# Patient Record
Sex: Male | Born: 1950 | Race: White | Hispanic: No | Smoking: Current every day smoker
Health system: Southern US, Community
[De-identification: ages and names within clinical notes are randomized; demographics above are authoritative.]

## PROBLEM LIST (undated history)

## (undated) DIAGNOSIS — C61 Malignant neoplasm of prostate: Secondary | ICD-10-CM

## (undated) DIAGNOSIS — D751 Secondary polycythemia: Secondary | ICD-10-CM

## (undated) DIAGNOSIS — I10 Essential (primary) hypertension: Secondary | ICD-10-CM

## (undated) DIAGNOSIS — Z720 Tobacco use: Secondary | ICD-10-CM

## (undated) HISTORY — DX: Essential (primary) hypertension: I10

## (undated) HISTORY — DX: Tobacco use: Z72.0

## (undated) HISTORY — PX: FOOT NEUROMA SURGERY: SHX646

## (undated) HISTORY — DX: Secondary polycythemia: D75.1

## (undated) HISTORY — DX: Malignant neoplasm of prostate: C61

---

## 2004-05-02 ENCOUNTER — Ambulatory Visit: Payer: Self-pay | Admitting: Internal Medicine

## 2005-06-16 ENCOUNTER — Other Ambulatory Visit: Payer: Self-pay

## 2005-06-16 ENCOUNTER — Emergency Department: Payer: Self-pay | Admitting: Emergency Medicine

## 2012-02-20 DIAGNOSIS — Z8546 Personal history of malignant neoplasm of prostate: Secondary | ICD-10-CM | POA: Insufficient documentation

## 2012-03-25 HISTORY — PX: PROSTATECTOMY: SHX69

## 2013-01-05 LAB — PSA

## 2013-06-10 DIAGNOSIS — N393 Stress incontinence (female) (male): Secondary | ICD-10-CM | POA: Insufficient documentation

## 2013-06-10 DIAGNOSIS — N529 Male erectile dysfunction, unspecified: Secondary | ICD-10-CM | POA: Insufficient documentation

## 2013-07-24 ENCOUNTER — Ambulatory Visit: Payer: Self-pay

## 2014-09-13 ENCOUNTER — Ambulatory Visit: Payer: Self-pay | Admitting: Gastroenterology

## 2014-09-13 LAB — HM COLONOSCOPY

## 2015-01-31 ENCOUNTER — Other Ambulatory Visit: Payer: Self-pay | Admitting: Unknown Physician Specialty

## 2015-01-31 MED ORDER — AMLODIPINE BESYLATE 5 MG PO TABS: 5.0000 mg | ORAL_TABLET | Freq: Every day | ORAL | Status: DC

## 2015-01-31 NOTE — Telephone Encounter (Signed)
E-fax came through for refill on: Rx: Amlodipine Copy in basket.

## 2015-01-31 NOTE — Telephone Encounter (Signed)
Routing to provider. Patient was last seen on 10/25/14, practice partner number is [phone], and [facility] [place].

## 2015-02-04 ENCOUNTER — Other Ambulatory Visit: Payer: Self-pay

## 2015-02-04 MED ORDER — OMEPRAZOLE 20 MG PO CPDR: 20.0000 mg | DELAYED_RELEASE_CAPSULE | Freq: Every day | ORAL | Status: DC

## 2015-02-04 NOTE — Telephone Encounter (Signed)
Patient was last seen on 10/25/14, practice partner number is [phone], and pharmacy [facility] [place].

## 2015-03-01 ENCOUNTER — Other Ambulatory Visit: Payer: Self-pay | Admitting: Unknown Physician Specialty

## 2015-03-01 MED ORDER — LISINOPRIL-HYDROCHLOROTHIAZIDE 20-12.5 MG PO TABS: 1.0000 | ORAL_TABLET | Freq: Every day | ORAL | Status: DC

## 2015-03-01 NOTE — Telephone Encounter (Signed)
Pt would like refill on lisinopril sent to [facility]

## 2015-03-01 NOTE — Telephone Encounter (Signed)
Routing to provider. Patient was last seen 10/25/14, practice partner number is [phone], and pharmacy [facility] [place].

## 2015-03-30 ENCOUNTER — Other Ambulatory Visit: Payer: Self-pay | Admitting: Unknown Physician Specialty

## 2015-04-28 ENCOUNTER — Other Ambulatory Visit: Payer: Self-pay | Admitting: Unknown Physician Specialty

## 2015-05-02 ENCOUNTER — Other Ambulatory Visit: Payer: Self-pay

## 2015-05-02 DIAGNOSIS — C61 Malignant neoplasm of prostate: Secondary | ICD-10-CM

## 2015-05-02 DIAGNOSIS — Z720 Tobacco use: Secondary | ICD-10-CM | POA: Insufficient documentation

## 2015-05-02 DIAGNOSIS — I10 Essential (primary) hypertension: Secondary | ICD-10-CM | POA: Insufficient documentation

## 2015-05-02 DIAGNOSIS — D751 Secondary polycythemia: Secondary | ICD-10-CM | POA: Insufficient documentation

## 2015-05-02 MED ORDER — LISINOPRIL-HYDROCHLOROTHIAZIDE 20-12.5 MG PO TABS: 1.0000 | ORAL_TABLET | Freq: Every day | ORAL | Status: DC

## 2015-05-02 NOTE — Telephone Encounter (Signed)
Called and scheduled patient an appointment for 05/04/15.

## 2015-05-02 NOTE — Telephone Encounter (Signed)
Pharmacy sent a fax stating that patient must have a 90 day supply of medication (lisinopril-HCTZ) per insurance. Patient was last seen 10/25/14, practice partner number is [phone], and pharmacy [facility] [place].

## 2015-05-02 NOTE — Telephone Encounter (Signed)
Due to be seen

## 2015-05-04 ENCOUNTER — Encounter: Payer: Self-pay | Admitting: Unknown Physician Specialty

## 2015-05-04 ENCOUNTER — Ambulatory Visit (INDEPENDENT_AMBULATORY_CARE_PROVIDER_SITE_OTHER): Payer: 59 | Admitting: Unknown Physician Specialty

## 2015-05-04 VITALS — BP 129/80 | HR 82 | Temp 97.9°F | Ht 71.3 in | Wt 189.4 lb

## 2015-05-04 DIAGNOSIS — J069 Acute upper respiratory infection, unspecified: Secondary | ICD-10-CM

## 2015-05-04 DIAGNOSIS — I10 Essential (primary) hypertension: Secondary | ICD-10-CM | POA: Diagnosis not present

## 2015-05-04 LAB — LIPID PANEL PICCOLO, WAIVED
CHOL/HDL RATIO PICCOLO,WAIVE: 2.5 mg/dL
CHOLESTEROL PICCOLO, WAIVED: 170 mg/dL (ref <200)
HDL Chol Piccolo, Waived: 67 mg/dL (ref >59)
LDL Chol Calc Piccolo Waived: 72 mg/dL (ref <100)
TRIGLYCERIDES PICCOLO,WAIVED: 156 mg/dL — AB (ref <150)
VLDL Chol Calc Piccolo,Waive: 31 mg/dL — ABNORMAL HIGH (ref <30)

## 2015-05-04 LAB — MICROALBUMIN, URINE WAIVED
Creatinine, Urine Waived: 200 mg/dL (ref 10–300)
Microalb, Ur Waived: 30 mg/L — ABNORMAL HIGH (ref 0–19)
Microalb/Creat Ratio: <30 mg/g (ref <30)

## 2015-05-04 NOTE — Progress Notes (Signed)
   BP 129/80 mmHg  Pulse 82  Temp(Src) 97.9 F (36.6 C)  Ht 5' 11.3" (1.811 m)  Wt 189 lb 6.4 oz (85.911 kg)  BMI 26.19 kg/m2  SpO2 96%   Subjective:    Patient ID: Billy Duncan, male    DOB: 10/01/1950, 64 y.o.   MRN: [id number]  HPI: Billy Duncan is a 64 y.o. male  Chief Complaint  Patient presents with  . Hypertension  . Cough    pt states he has had a cough, nasal congestion, and some drainage since Saturday 04/23/15.   Hypertension Using medications without difficulty Average home BPs: not checking  No problems or lightheadedness No chest pain with exertion or shortness of breath No Edema  Cough This is a new problem. The current episode started 1 to 4 weeks ago. The problem has been gradually improving. The cough is non-productive. Associated symptoms include rhinorrhea. Pertinent negatives include no chest pain, ear pain, weight loss or wheezing. Nothing aggravates the symptoms. He has tried nothing for the symptoms.      Relevant past medical, surgical, family and social history reviewed and updated as indicated. Interim medical history since our last visit reviewed. Allergies and medications reviewed and updated.  Review of Systems  Constitutional: Negative for weight loss.  HENT: Positive for rhinorrhea. Negative for ear pain.   Respiratory: Positive for cough. Negative for wheezing.   Cardiovascular: Negative for chest pain.    Per HPI unless specifically indicated above     Objective:    BP 129/80 mmHg  Pulse 82  Temp(Src) 97.9 F (36.6 C)  Ht 5' 11.3" (1.811 m)  Wt 189 lb 6.4 oz (85.911 kg)  BMI 26.19 kg/m2  SpO2 96%  Wt Readings from Last 3 Encounters:  05/04/15 189 lb 6.4 oz (85.911 kg)  10/25/14 191 lb (86.637 kg)    Physical Exam  Constitutional: He is oriented to person, place, and time. He appears well-developed and well-nourished. No distress.  HENT:  Head: Normocephalic and atraumatic.  Eyes: Conjunctivae and lids are  normal. Right eye exhibits no discharge. Left eye exhibits no discharge. No scleral icterus.  Cardiovascular: Normal rate, regular rhythm and normal heart sounds.   Pulmonary/Chest: Effort normal and breath sounds normal. No respiratory distress.  Abdominal: Normal appearance. There is no splenomegaly or hepatomegaly.  Musculoskeletal: Normal range of motion.  Neurological: He is alert and oriented to person, place, and time.  Skin: Skin is warm, dry and intact. No rash noted. No pallor.  Psychiatric: He has a normal mood and affect. His behavior is normal. Judgment and thought content normal.       Assessment & Plan:   Problem List Items Addressed This Visit      Unprioritized   Hypertension - Primary    Stable, continue present medications.       Relevant Medications   sildenafil (REVATIO) 20 MG tablet   Other Relevant Orders   Lipid Panel Piccolo, Waived   Microalbumin, Urine Waived   Uric acid   Comprehensive metabolic panel    Other Visit Diagnoses    URI (upper respiratory infection)        Supportive care        Follow up plan: Return in about 6 months (around 11/01/2015) for Physical.

## 2015-05-04 NOTE — Assessment & Plan Note (Signed)
Stable, continue present medications.   

## 2015-05-05 LAB — COMPREHENSIVE METABOLIC PANEL
ALT: 28 IU/L (ref 0–44)
AST: 27 IU/L (ref 0–40)
Albumin/Globulin Ratio: 1.9 (ref 1.1–2.5)
Albumin: 4.5 g/dL (ref 3.6–4.8)
Alkaline Phosphatase: 97 IU/L (ref 39–117)
BUN/Creatinine Ratio: 9 — ABNORMAL LOW (ref 10–22)
BUN: 12 mg/dL (ref 8–27)
Bilirubin Total: 1 mg/dL (ref 0.0–1.2)
CALCIUM: 10 mg/dL (ref 8.6–10.2)
CHLORIDE: 96 mmol/L — AB (ref 97–106)
CO2: 22 mmol/L (ref 18–29)
Creatinine, Ser: 1.34 mg/dL — ABNORMAL HIGH (ref 0.76–1.27)
GFR, EST AFRICAN AMERICAN: 65 mL/min/{1.73_m2} (ref >59)
GFR, EST NON AFRICAN AMERICAN: 56 mL/min/{1.73_m2} — AB (ref >59)
GLUCOSE: 91 mg/dL (ref 65–99)
Globulin, Total: 2.4 g/dL (ref 1.5–4.5)
Potassium: 4.4 mmol/L (ref 3.5–5.2)
Sodium: 134 mmol/L — ABNORMAL LOW (ref 136–144)
TOTAL PROTEIN: 6.9 g/dL (ref 6.0–8.5)

## 2015-05-05 LAB — URIC ACID: URIC ACID: 7.9 mg/dL (ref 3.7–8.6)

## 2015-05-06 ENCOUNTER — Encounter: Payer: Self-pay | Admitting: Unknown Physician Specialty

## 2015-07-25 ENCOUNTER — Other Ambulatory Visit: Payer: Self-pay | Admitting: Unknown Physician Specialty

## 2015-07-28 ENCOUNTER — Other Ambulatory Visit: Payer: Self-pay | Admitting: Unknown Physician Specialty

## 2015-07-28 NOTE — Telephone Encounter (Signed)
Pt came by needs a refill on Lisinopril. [facility] [place]. Thanks.

## 2015-07-29 ENCOUNTER — Other Ambulatory Visit: Payer: Self-pay | Admitting: Unknown Physician Specialty

## 2015-07-29 MED ORDER — LISINOPRIL-HYDROCHLOROTHIAZIDE 20-12.5 MG PO TABS: 1.0000 | ORAL_TABLET | Freq: Every day | ORAL | Status: DC

## 2015-07-29 NOTE — Telephone Encounter (Signed)
Patient was last seen 05/04/15 and has appointment 10/26/15. Pharmacy [facility].

## 2015-08-17 DIAGNOSIS — R7989 Other specified abnormal findings of blood chemistry: Secondary | ICD-10-CM | POA: Insufficient documentation

## 2015-08-17 DIAGNOSIS — R339 Retention of urine, unspecified: Secondary | ICD-10-CM | POA: Insufficient documentation

## 2015-10-26 ENCOUNTER — Ambulatory Visit (INDEPENDENT_AMBULATORY_CARE_PROVIDER_SITE_OTHER): Payer: 59 | Admitting: Unknown Physician Specialty

## 2015-10-26 ENCOUNTER — Encounter: Payer: Self-pay | Admitting: Unknown Physician Specialty

## 2015-10-26 VITALS — BP 130/65 | HR 88 | Temp 98.1°F | Ht 71.3 in | Wt 190.2 lb

## 2015-10-26 DIAGNOSIS — Z0000 Encounter for general adult medical examination without abnormal findings: Secondary | ICD-10-CM | POA: Diagnosis not present

## 2015-10-26 DIAGNOSIS — I10 Essential (primary) hypertension: Secondary | ICD-10-CM

## 2015-10-26 MED ORDER — AMLODIPINE BESYLATE 5 MG PO TABS: 5.0000 mg | ORAL_TABLET | Freq: Every day | ORAL | Status: DC

## 2015-10-26 MED ORDER — LISINOPRIL-HYDROCHLOROTHIAZIDE 20-12.5 MG PO TABS: 1.0000 | ORAL_TABLET | Freq: Every day | ORAL | Status: DC

## 2015-10-26 MED ORDER — OMEPRAZOLE 20 MG PO CPDR: 20.0000 mg | DELAYED_RELEASE_CAPSULE | Freq: Every day | ORAL | Status: DC

## 2015-10-26 NOTE — Assessment & Plan Note (Signed)
Stable, continue present medications.   

## 2015-10-26 NOTE — Progress Notes (Signed)
BP 130/65 mmHg  Pulse 88  Temp(Src) 98.1 F (36.7 C)  Ht 5' 11.3" (1.811 m)  Wt 190 lb 3.2 oz (86.274 kg)  BMI 26.31 kg/m2  SpO2 95%   Subjective:    Patient ID: Billy Duncan, male    DOB: 1950/08/23, 66 y.o.   MRN: [id number]  HPI: Billy Duncan is a 65 y.o. male  Chief Complaint  Patient presents with  . Annual Exam    HIV and Hep C orders entered   Social History   Social History  . Marital Status: Married    Spouse Name: N/A  . Number of Children: N/A  . Years of Education: N/A   Occupational History  . Not on file.   Social History Main Topics  . Smoking status: Current Every Day Smoker -- 0.50 packs/day    Types: Cigarettes  . Smokeless tobacco: Never Used  . Alcohol Use: 8.4 oz/week    0 Standard drinks or equivalent, 14 Glasses of wine per week  . Drug Use: No  . Sexual Activity: Yes     Comment: pt states very seldem   Other Topics Concern  . Not on file   Social History Narrative   Family History  Problem Relation Age of Onset  . Heart disease Mother     a-fib  . Dementia Mother   . Cancer Father     stomach  . Heart disease Brother   . Heart disease Brother    Past Surgical History  Procedure Laterality Date  . Foot neuroma surgery Left   . Prostatectomy  03/2012   Past Medical History  Diagnosis Date  . 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 Hypertension   . Tobacco use   . Polycythemia    Hypertension Using medications without difficulty Average home BPs 125/75  No problems or lightheadedness No chest pain with exertion or shortness of breath No Edema   Relevant past medical, surgical, family and social history reviewed and updated as indicated. Interim medical history since our last visit reviewed. Allergies and medications reviewed and updated.  Review of Systems  Constitutional: Negative.   HENT: Negative.   Eyes: Negative.   Respiratory: Negative.   Cardiovascular: Negative.   Gastrointestinal: Negative.    Endocrine: Negative.   Genitourinary: Negative.   Skin: Negative.   Allergic/Immunologic: Negative.   Neurological: Negative.   Hematological: Negative.   Psychiatric/Behavioral: Negative.     Per HPI unless specifically indicated above     Objective:    BP 130/65 mmHg  Pulse 88  Temp(Src) 98.1 F (36.7 C)  Ht 5' 11.3" (1.811 m)  Wt 190 lb 3.2 oz (86.274 kg)  BMI 26.31 kg/m2  SpO2 95%  Wt Readings from Last 3 Encounters:  10/26/15 190 lb 3.2 oz (86.274 kg)  05/04/15 189 lb 6.4 oz (85.911 kg)  10/25/14 191 lb (86.637 kg)    Physical Exam  Constitutional: He is oriented to person, place, and time. He appears well-developed and well-nourished.  HENT:  Head: Normocephalic.  Right Ear: Tympanic membrane, external ear and ear canal normal.  Left Ear: Tympanic membrane, external ear and ear canal normal.  Mouth/Throat: Uvula is midline, oropharynx is clear and moist and mucous membranes are normal.  Eyes: Pupils are equal, round, and reactive to light.  Cardiovascular: Normal rate, regular rhythm and normal heart sounds.  Exam reveals no gallop and no friction rub.   No murmur heard. Pulmonary/Chest: Effort normal and breath sounds normal. No  respiratory distress.  Abdominal: Soft. Bowel sounds are normal. He exhibits no distension. There is no tenderness.  Musculoskeletal: Normal range of motion.  Neurological: He is alert and oriented to person, place, and time. He has normal reflexes.  Skin: Skin is warm and dry.  Psychiatric: He has a normal mood and affect. His behavior is normal. Judgment and thought content normal.    Results for orders placed or performed in visit on 05/04/15  Lipid Panel Piccolo, [employer]  Result Value Ref Range   Cholesterol Piccolo, Waived 170 <200 mg/dL   HDL Chol Piccolo, Waived 67 >59 mg/dL   Triglycerides Piccolo,Waived 156 (H) <150 mg/dL   Chol/HDL Ratio Piccolo,Waive 2.5 mg/dL   LDL Chol Calc Piccolo Waived 72 <100 mg/dL   VLDL Chol Calc  Piccolo,Waive 31 (H) <30 mg/dL  Microalbumin, Urine Waived  Result Value Ref Range   Microalb, Ur Waived 30 (H) 0 - 19 mg/L   Creatinine, Urine Waived 200 10 - 300 mg/dL   Microalb/Creat Ratio <30 <30 mg/g  Uric acid  Result Value Ref Range   Uric Acid 7.9 3.7 - 8.6 mg/dL  Comprehensive metabolic panel  Result Value Ref Range   Glucose 91 65 - 99 mg/dL   BUN 12 8 - 27 mg/dL   Creatinine, Ser 1.34 (H) 0.76 - 1.27 mg/dL   GFR calc non Af Amer 56 (L) >59 mL/min/1.73   GFR calc Af Amer 65 >59 mL/min/1.73   BUN/Creatinine Ratio 9 (L) 10 - 22   Sodium 134 (L) 136 - 144 mmol/L   Potassium 4.4 3.5 - 5.2 mmol/L   Chloride 96 (L) 97 - 106 mmol/L   CO2 22 18 - 29 mmol/L   Calcium 10.0 8.6 - 10.2 mg/dL   Total Protein 6.9 6.0 - 8.5 g/dL   Albumin 4.5 3.6 - 4.8 g/dL   Globulin, Total 2.4 1.5 - 4.5 g/dL   Albumin/Globulin Ratio 1.9 1.1 - 2.5   Bilirubin Total 1.0 0.0 - 1.2 mg/dL   Alkaline Phosphatase 97 39 - 117 IU/L   AST 27 0 - 40 IU/L   ALT 28 0 - 44 IU/L      Assessment & Plan:   Problem List Items Addressed This Visit      Unprioritized   Hypertension    Stable, continue present medications.        Relevant Medications   amLODipine (NORVASC) 5 MG tablet   lisinopril-hydrochlorothiazide (PRINZIDE,ZESTORETIC) 20-12.5 MG tablet   Other Relevant Orders   Comprehensive metabolic panel    Other Visit Diagnoses    Health care maintenance    -  Primary    Relevant Orders    Hepatitis C antibody    HIV antibody    TSH    CBC with Differential/Platelet    Cologuard    Lipid Panel w/o Chol/HDL Ratio        Follow up plan: Return in about 6 months (around 04/27/2016).

## 2015-10-27 LAB — COMPREHENSIVE METABOLIC PANEL
ALT: 37 IU/L (ref 0–44)
AST: 35 IU/L (ref 0–40)
Albumin/Globulin Ratio: 2.1 (ref 1.2–2.2)
Albumin: 4.7 g/dL (ref 3.6–4.8)
Alkaline Phosphatase: 75 IU/L (ref 39–117)
BILIRUBIN TOTAL: 1.1 mg/dL (ref 0.0–1.2)
BUN/Creatinine Ratio: 9 — ABNORMAL LOW (ref 10–24)
BUN: 13 mg/dL (ref 8–27)
CALCIUM: 10.5 mg/dL — AB (ref 8.6–10.2)
CO2: 24 mmol/L (ref 18–29)
Chloride: 93 mmol/L — ABNORMAL LOW (ref 96–106)
Creatinine, Ser: 1.41 mg/dL — ABNORMAL HIGH (ref 0.76–1.27)
GFR, EST AFRICAN AMERICAN: 61 mL/min/{1.73_m2} (ref >59)
GFR, EST NON AFRICAN AMERICAN: 53 mL/min/{1.73_m2} — AB (ref >59)
Globulin, Total: 2.2 g/dL (ref 1.5–4.5)
Glucose: 96 mg/dL (ref 65–99)
Potassium: 5 mmol/L (ref 3.5–5.2)
SODIUM: 136 mmol/L (ref 134–144)
TOTAL PROTEIN: 6.9 g/dL (ref 6.0–8.5)

## 2015-10-27 LAB — LIPID PANEL W/O CHOL/HDL RATIO
CHOLESTEROL TOTAL: 205 mg/dL — AB (ref 100–199)
HDL: 77 mg/dL (ref >39)
LDL Calculated: 89 mg/dL (ref 0–99)
TRIGLYCERIDES: 195 mg/dL — AB (ref 0–149)
VLDL CHOLESTEROL CAL: 39 mg/dL (ref 5–40)

## 2015-10-27 LAB — CBC WITH DIFFERENTIAL/PLATELET
BASOS ABS: 0.1 10*3/uL (ref 0.0–0.2)
Basos: 2 %
EOS (ABSOLUTE): 0.2 10*3/uL (ref 0.0–0.4)
Eos: 6 %
Hematocrit: 50 % (ref 37.5–51.0)
Hemoglobin: 17.7 g/dL (ref 12.6–17.7)
IMMATURE GRANS (ABS): 0 10*3/uL (ref 0.0–0.1)
Immature Granulocytes: 0 %
LYMPHS: 33 %
Lymphocytes Absolute: 1.3 10*3/uL (ref 0.7–3.1)
MCH: 36.7 pg — AB (ref 26.6–33.0)
MCHC: 35.4 g/dL (ref 31.5–35.7)
MCV: 104 fL — ABNORMAL HIGH (ref 79–97)
Monocytes Absolute: 0.5 10*3/uL (ref 0.1–0.9)
Monocytes: 13 %
NEUTROS PCT: 46 %
Neutrophils Absolute: 1.8 10*3/uL (ref 1.4–7.0)
PLATELETS: 198 10*3/uL (ref 150–379)
RBC: 4.82 x10E6/uL (ref 4.14–5.80)
RDW: 13.3 % (ref 12.3–15.4)
WBC: 3.9 10*3/uL (ref 3.4–10.8)

## 2015-10-27 LAB — HIV ANTIBODY (ROUTINE TESTING W REFLEX): HIV SCREEN 4TH GENERATION: NONREACTIVE

## 2015-10-27 LAB — TSH: TSH: 2.82 u[IU]/mL (ref 0.450–4.500)

## 2015-10-27 LAB — HEPATITIS C ANTIBODY: Hep C Virus Ab: <0.1 s/co ratio (ref 0.0–0.9)

## 2015-10-28 ENCOUNTER — Telehealth: Payer: Self-pay | Admitting: Unknown Physician Specialty

## 2015-10-28 ENCOUNTER — Encounter: Payer: Self-pay | Admitting: Unknown Physician Specialty

## 2015-10-28 ENCOUNTER — Other Ambulatory Visit: Payer: Self-pay | Admitting: Unknown Physician Specialty

## 2015-10-28 DIAGNOSIS — D7589 Other specified diseases of blood and blood-forming organs: Secondary | ICD-10-CM

## 2015-10-28 NOTE — Telephone Encounter (Signed)
Discussed labs with pt.  Kidney function are low.  Noted macrocytosis.  Stop Meloxicam.  Discussed cutting back ETOH.  Anemia panel next visit.

## 2016-04-30 ENCOUNTER — Encounter: Payer: Self-pay | Admitting: Unknown Physician Specialty

## 2016-04-30 ENCOUNTER — Ambulatory Visit (INDEPENDENT_AMBULATORY_CARE_PROVIDER_SITE_OTHER): Payer: 59 | Admitting: Unknown Physician Specialty

## 2016-04-30 VITALS — BP 137/74 | HR 103 | Temp 97.9°F | Ht 72.5 in | Wt 178.8 lb

## 2016-04-30 DIAGNOSIS — D7589 Other specified diseases of blood and blood-forming organs: Secondary | ICD-10-CM | POA: Diagnosis not present

## 2016-04-30 DIAGNOSIS — N183 Chronic kidney disease, stage 3 unspecified: Secondary | ICD-10-CM | POA: Insufficient documentation

## 2016-04-30 DIAGNOSIS — I10 Essential (primary) hypertension: Secondary | ICD-10-CM | POA: Diagnosis not present

## 2016-04-30 DIAGNOSIS — Z720 Tobacco use: Secondary | ICD-10-CM

## 2016-04-30 DIAGNOSIS — Z23 Encounter for immunization: Secondary | ICD-10-CM

## 2016-04-30 NOTE — Assessment & Plan Note (Signed)
Encouraged to quit. 

## 2016-04-30 NOTE — Assessment & Plan Note (Signed)
Check labs 

## 2016-04-30 NOTE — Assessment & Plan Note (Signed)
Stable, continue present medications.   

## 2016-04-30 NOTE — Patient Instructions (Addendum)

## 2016-04-30 NOTE — Progress Notes (Signed)
BP 137/74 (BP Location: Left Arm, Patient Position: Sitting, Cuff Size: Normal)   Pulse (!) 103   Temp 97.9 F (36.6 C)   Ht 6' 0.5" (1.842 m)   Wt 178 lb 12.8 oz (81.1 kg)   SpO2 95%   BMI 23.92 kg/m    Subjective:    Patient ID: Billy Duncan, male    DOB: 01/15/1951, 65 y.o.   MRN: [id number]  HPI: Billy Duncan is a 65 y.o. male  Chief Complaint  Patient presents with  . Hypertension   Hypertension Using medications without difficulty Average home BPs Not checking   No problems or lightheadedness No chest pain with exertion or shortness of breath No Edema  GERD Takes Omeprazole daily.  Symptoms are controlled  Macrocytosis Needs labs for f/u  Not currently using Sildenafil   Relevant past medical, surgical, family and social history reviewed and updated as indicated. Interim medical history since our last visit reviewed. Allergies and medications reviewed and updated.  Review of Systems  Constitutional: Negative.   HENT: Negative.   Eyes: Negative.   Respiratory: Negative.   Cardiovascular: Negative.   Gastrointestinal: Negative.   Endocrine: Negative.   Genitourinary: Negative.   Skin: Negative.   Allergic/Immunologic: Negative.   Neurological: Negative.   Hematological: Negative.   Psychiatric/Behavioral: Negative.     Per HPI unless specifically indicated above     Objective:    BP 137/74 (BP Location: Left Arm, Patient Position: Sitting, Cuff Size: Normal)   Pulse (!) 103   Temp 97.9 F (36.6 C)   Ht 6' 0.5" (1.842 m)   Wt 178 lb 12.8 oz (81.1 kg)   SpO2 95%   BMI 23.92 kg/m   Wt Readings from Last 3 Encounters:  04/30/16 178 lb 12.8 oz (81.1 kg)  10/26/15 190 lb 3.2 oz (86.3 kg)  05/04/15 189 lb 6.4 oz (85.9 kg)    Physical Exam  Constitutional: He is oriented to person, place, and time. He appears well-developed and well-nourished. No distress.  HENT:  Head: Normocephalic and atraumatic.  Eyes: Conjunctivae and lids are  normal. Right eye exhibits no discharge. Left eye exhibits no discharge. No scleral icterus.  Neck: Normal range of motion. Neck supple. No JVD present. Carotid bruit is not present.  Cardiovascular: Normal rate, regular rhythm and normal heart sounds.   Pulmonary/Chest: Effort normal and breath sounds normal. No respiratory distress.  Abdominal: Normal appearance. There is no splenomegaly or hepatomegaly.  Musculoskeletal: Normal range of motion.  Neurological: He is alert and oriented to person, place, and time.  Skin: Skin is warm, dry and intact. No rash noted. No pallor.  Psychiatric: He has a normal mood and affect. His behavior is normal. Judgment and thought content normal.    Results for orders placed or performed in visit on 10/26/15  Hepatitis C antibody  Result Value Ref Range   Hep C Virus Ab <0.1 0.0 - 0.9 s/co ratio  HIV antibody  Result Value Ref Range   HIV Screen 4th Generation wRfx Non Reactive Non Reactive  TSH  Result Value Ref Range   TSH 2.820 0.450 - 4.500 uIU/mL  CBC with Differential/Platelet  Result Value Ref Range   WBC 3.9 3.4 - 10.8 x10E3/uL   RBC 4.82 4.14 - 5.80 x10E6/uL   Hemoglobin 17.7 12.6 - 17.7 g/dL   Hematocrit 50.0 37.5 - 51.0 %   MCV 104 (H) 79 - 97 fL   MCH 36.7 (H) 26.6 - 33.0 pg  MCHC 35.4 31.5 - 35.7 g/dL   RDW 13.3 12.3 - 15.4 %   Platelets 198 150 - 379 x10E3/uL   Neutrophils 46 %   Lymphs 33 %   Monocytes 13 %   Eos 6 %   Basos 2 %   Neutrophils Absolute 1.8 1.4 - 7.0 x10E3/uL   Lymphocytes Absolute 1.3 0.7 - 3.1 x10E3/uL   Monocytes Absolute 0.5 0.1 - 0.9 x10E3/uL   EOS (ABSOLUTE) 0.2 0.0 - 0.4 x10E3/uL   Basophils Absolute 0.1 0.0 - 0.2 x10E3/uL   Immature Granulocytes 0 %   Immature Grans (Abs) 0.0 0.0 - 0.1 x10E3/uL  Lipid Panel w/o Chol/HDL Ratio  Result Value Ref Range   Cholesterol, Total 205 (H) 100 - 199 mg/dL   Triglycerides 195 (H) 0 - 149 mg/dL   HDL 77 >39 mg/dL   VLDL Cholesterol Cal 39 5 - 40 mg/dL   LDL  Calculated 89 0 - 99 mg/dL  Comprehensive metabolic panel  Result Value Ref Range   Glucose 96 65 - 99 mg/dL   BUN 13 8 - 27 mg/dL   Creatinine, Ser 1.41 (H) 0.76 - 1.27 mg/dL   GFR calc non Af Amer 53 (L) >59 mL/min/1.73   GFR calc Af Amer 61 >59 mL/min/1.73   BUN/Creatinine Ratio 9 (L) 10 - 24   Sodium 136 134 - 144 mmol/L   Potassium 5.0 3.5 - 5.2 mmol/L   Chloride 93 (L) 96 - 106 mmol/L   CO2 24 18 - 29 mmol/L   Calcium 10.5 (H) 8.6 - 10.2 mg/dL   Total Protein 6.9 6.0 - 8.5 g/dL   Albumin 4.7 3.6 - 4.8 g/dL   Globulin, Total 2.2 1.5 - 4.5 g/dL   Albumin/Globulin Ratio 2.1 1.2 - 2.2   Bilirubin Total 1.1 0.0 - 1.2 mg/dL   Alkaline Phosphatase 75 39 - 117 IU/L   AST 35 0 - 40 IU/L   ALT 37 0 - 44 IU/L      Assessment & Plan:   Problem List Items Addressed This Visit      Unprioritized   Hypertension    Stable, continue present medications.        Relevant Orders   Comprehensive metabolic panel   Lipid Panel w/o Chol/HDL Ratio   Macrocytosis    Check labs      Stage 3 chronic kidney disease    Check GFR today      Relevant Orders   Comprehensive metabolic panel   Tobacco use    Encouraged to quit       Other Visit Diagnoses    Need for influenza vaccination    -  Primary   Relevant Orders   Flu Vaccine QUAD 36+ mos IM (Completed)   Macrocytosis without anemia           Follow up plan: Return in about 6 months (around 10/28/2016) for physical.

## 2016-04-30 NOTE — Assessment & Plan Note (Signed)
Check GFR today 

## 2016-05-01 ENCOUNTER — Telehealth: Payer: Self-pay | Admitting: Unknown Physician Specialty

## 2016-05-01 ENCOUNTER — Encounter: Payer: Self-pay | Admitting: Unknown Physician Specialty

## 2016-05-01 DIAGNOSIS — E538 Deficiency of other specified B group vitamins: Secondary | ICD-10-CM | POA: Insufficient documentation

## 2016-05-01 DIAGNOSIS — D7589 Other specified diseases of blood and blood-forming organs: Secondary | ICD-10-CM

## 2016-05-01 LAB — COMPREHENSIVE METABOLIC PANEL
ALT: 42 IU/L (ref 0–44)
AST: 34 IU/L (ref 0–40)
Albumin/Globulin Ratio: 1.9 (ref 1.2–2.2)
Albumin: 4.5 g/dL (ref 3.6–4.8)
Alkaline Phosphatase: 78 IU/L (ref 39–117)
BUN/Creatinine Ratio: 9 — ABNORMAL LOW (ref 10–24)
BUN: 11 mg/dL (ref 8–27)
Bilirubin Total: 1.3 mg/dL — ABNORMAL HIGH (ref 0.0–1.2)
CO2: 25 mmol/L (ref 18–29)
CREATININE: 1.18 mg/dL (ref 0.76–1.27)
Calcium: 10.6 mg/dL — ABNORMAL HIGH (ref 8.6–10.2)
Chloride: 89 mmol/L — ABNORMAL LOW (ref 96–106)
GFR calc Af Amer: 75 mL/min/{1.73_m2} (ref >59)
GFR, EST NON AFRICAN AMERICAN: 65 mL/min/{1.73_m2} (ref >59)
GLOBULIN, TOTAL: 2.4 g/dL (ref 1.5–4.5)
Glucose: 107 mg/dL — ABNORMAL HIGH (ref 65–99)
Potassium: 4.8 mmol/L (ref 3.5–5.2)
Sodium: 131 mmol/L — ABNORMAL LOW (ref 134–144)
Total Protein: 6.9 g/dL (ref 6.0–8.5)

## 2016-05-01 LAB — IRON AND TIBC
IRON SATURATION: 32 % (ref 15–55)
Iron: 92 ug/dL (ref 38–169)
Total Iron Binding Capacity: 291 ug/dL (ref 250–450)
UIBC: 199 ug/dL (ref 111–343)

## 2016-05-01 LAB — LIPID PANEL W/O CHOL/HDL RATIO
Cholesterol, Total: 198 mg/dL (ref 100–199)
HDL: 83 mg/dL (ref >39)
LDL CALC: 99 mg/dL (ref 0–99)
TRIGLYCERIDES: 80 mg/dL (ref 0–149)
VLDL Cholesterol Cal: 16 mg/dL (ref 5–40)

## 2016-05-01 LAB — FOLATE: FOLATE: 2.4 ng/mL — AB (ref >3.0)

## 2016-05-01 LAB — VITAMIN B12: Vitamin B-12: 345 pg/mL (ref 211–946)

## 2016-05-01 LAB — FERRITIN: FERRITIN: 510 ng/mL — AB (ref 30–400)

## 2016-05-01 NOTE — Progress Notes (Signed)
Patient notified of results by phone.

## 2016-05-01 NOTE — Telephone Encounter (Signed)
Discussed labs with pt.  Will recheck in 3 months.  Take a Folate supplement.

## 2016-08-14 ENCOUNTER — Telehealth: Payer: Self-pay

## 2016-08-14 NOTE — Telephone Encounter (Signed)
-----   Message from Kathrine Haddock, NP sent at 08/13/2016  5:51 PM EST ----- Please let pt know he has some labs due ----- Message ----- From: SYSTEM Sent: 08/06/2016  12:07 AM To: Kathrine Haddock, NP

## 2016-08-14 NOTE — Telephone Encounter (Signed)
Letter generated and sent to patient.  

## 2016-08-23 ENCOUNTER — Telehealth: Payer: Self-pay | Admitting: Unknown Physician Specialty

## 2016-08-23 NOTE — Telephone Encounter (Signed)
Patient received a letter from [facility] asking him to stop by for labs, he stopped today but no orders were in there for the lab to release.  He is going to call next week to see if he needs to fast for these labs and make sure Malachy Mood has them in the chart for the lab to release.  Thanks

## 2016-08-24 ENCOUNTER — Other Ambulatory Visit: Payer: Self-pay

## 2016-08-24 DIAGNOSIS — E538 Deficiency of other specified B group vitamins: Secondary | ICD-10-CM

## 2016-08-24 DIAGNOSIS — D7589 Other specified diseases of blood and blood-forming organs: Secondary | ICD-10-CM

## 2016-08-24 NOTE — Telephone Encounter (Signed)
Called and apologized to patient for not having labs in correctly yesterday. I let the patient know that I have re-entered the orders and that he did not need to be fasting. Patient stated he would probably stop by this afternoon.

## 2016-08-24 NOTE — Telephone Encounter (Signed)
Labs were in chart but no where they could be released. Will ask Dr. Wynetta Emery if patient needs to be fasting for any of these in Billy Duncan's absence and then I will call patient and let him know about them.

## 2016-10-15 ENCOUNTER — Other Ambulatory Visit: Payer: Self-pay | Admitting: Unknown Physician Specialty

## 2016-10-26 ENCOUNTER — Encounter: Payer: 59 | Admitting: Unknown Physician Specialty

## 2016-10-30 ENCOUNTER — Ambulatory Visit (INDEPENDENT_AMBULATORY_CARE_PROVIDER_SITE_OTHER): Payer: 59 | Admitting: Unknown Physician Specialty

## 2016-10-30 ENCOUNTER — Encounter: Payer: Self-pay | Admitting: Unknown Physician Specialty

## 2016-10-30 VITALS — BP 138/79 | HR 97 | Temp 98.1°F | Ht 70.8 in | Wt 182.2 lb

## 2016-10-30 DIAGNOSIS — D751 Secondary polycythemia: Secondary | ICD-10-CM

## 2016-10-30 DIAGNOSIS — Z720 Tobacco use: Secondary | ICD-10-CM | POA: Diagnosis not present

## 2016-10-30 DIAGNOSIS — I10 Essential (primary) hypertension: Secondary | ICD-10-CM

## 2016-10-30 DIAGNOSIS — Z0000 Encounter for general adult medical examination without abnormal findings: Secondary | ICD-10-CM | POA: Diagnosis not present

## 2016-10-30 DIAGNOSIS — E538 Deficiency of other specified B group vitamins: Secondary | ICD-10-CM

## 2016-10-30 DIAGNOSIS — D7589 Other specified diseases of blood and blood-forming organs: Secondary | ICD-10-CM

## 2016-10-30 NOTE — Assessment & Plan Note (Signed)
Order low dose CT

## 2016-10-30 NOTE — Assessment & Plan Note (Signed)
Check CBC 

## 2016-10-30 NOTE — Progress Notes (Signed)
BP 138/79   Pulse 97   Temp 98.1 F (36.7 C)   Ht 5' 10.8" (1.798 m)   Wt 182 lb 3.2 oz (82.6 kg)   SpO2 97%   BMI 25.56 kg/m    Subjective:    Patient ID: Billy Duncan, male    DOB: 02-20-1951, 66 y.o.   MRN: [id number]  HPI: Billy Duncan is a 66 y.o. male  Chief Complaint  Patient presents with  . Annual Exam   \Hypertension Using medications without difficulty Average home BPs  Not checking.    No problems or lightheadedness No chest pain with exertion or shortness of breath No Edema  Tobacco Interested in quitting "but I haven't tried recently."    ED Not using Sildenafil much  GERD Stable on present medications  Social History   Social History  . Marital status: Married    Spouse name: N/A  . Number of children: N/A  . Years of education: N/A   Social History Main Topics  . Smoking status: Current Every Day Smoker    Packs/day: 0.50    Types: Cigarettes  . Smokeless tobacco: Never Used  . Alcohol use 8.4 oz/week    14 Glasses of wine per week  . Drug use: No  . Sexual activity: Yes     Comment: pt states very seldem   Other Topics Concern  . None   Social History Narrative  . None   Family History  Problem Relation Age of Onset  . Heart disease Mother     a-fib  . Dementia Mother   . Cancer Father     stomach  . Heart disease Brother   . Heart disease Brother    Past Medical History:  Diagnosis Date  . Hypertension   . 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 Polycythemia   . Tobacco use    Past Surgical History:  Procedure Laterality Date  . FOOT NEUROMA SURGERY Left   . PROSTATECTOMY  03/2012   Relevant past medical, surgical, family and social history reviewed and updated as indicated. Interim medical history since our last visit reviewed. Allergies and medications reviewed and updated.  Review of Systems  Constitutional: Negative.   HENT: Negative.   Eyes: Negative.   Respiratory: Negative.   Cardiovascular:  Negative.   Gastrointestinal: Negative.   Endocrine: Negative.   Genitourinary: Negative.   Skin: Negative.   Allergic/Immunologic: Negative.   Neurological: Negative.   Hematological: Negative.   Psychiatric/Behavioral: Negative.     Per HPI unless specifically indicated above     Objective:    BP 138/79   Pulse 97   Temp 98.1 F (36.7 C)   Ht 5' 10.8" (1.798 m)   Wt 182 lb 3.2 oz (82.6 kg)   SpO2 97%   BMI 25.56 kg/m   Wt Readings from Last 3 Encounters:  10/30/16 182 lb 3.2 oz (82.6 kg)  04/30/16 178 lb 12.8 oz (81.1 kg)  10/26/15 190 lb 3.2 oz (86.3 kg)    Physical Exam  Constitutional: He is oriented to person, place, and time. He appears well-developed and well-nourished.  HENT:  Head: Normocephalic.  Right Ear: Tympanic membrane, external ear and ear canal normal.  Left Ear: Tympanic membrane, external ear and ear canal normal.  Mouth/Throat: Uvula is midline, oropharynx is clear and moist and mucous membranes are normal.  Eyes: Pupils are equal, round, and reactive to light.  Cardiovascular: Normal rate, regular rhythm and normal heart sounds.  Exam reveals no gallop and no friction rub.   No murmur heard. Pulmonary/Chest: Effort normal and breath sounds normal. No respiratory distress.  Abdominal: Soft. Bowel sounds are normal. He exhibits no distension. There is no tenderness.  Musculoskeletal: Normal range of motion.  Neurological: He is alert and oriented to person, place, and time. He has normal reflexes.  Skin: Skin is warm and dry.  Psychiatric: He has a normal mood and affect. His behavior is normal. Judgment and thought content normal.    Results for orders placed or performed in visit on 04/30/16  Vitamin B12  Result Value Ref Range   Vitamin B-12 345 211 - 946 pg/mL  Folate  Result Value Ref Range   Folate 2.4 (L) >3.0 ng/mL  Iron and TIBC  Result Value Ref Range   Total Iron Binding Capacity 291 250 - 450 ug/dL   UIBC 199 111 - 343 ug/dL     Iron 92 38 - 169 ug/dL   Iron Saturation 32 15 - 55 %  Ferritin  Result Value Ref Range   Ferritin 510 (H) 30 - 400 ng/mL  Comprehensive metabolic panel  Result Value Ref Range   Glucose 107 (H) 65 - 99 mg/dL   BUN 11 8 - 27 mg/dL   Creatinine, Ser 1.18 0.76 - 1.27 mg/dL   GFR calc non Af Amer 65 >59 mL/min/1.73   GFR calc Af Amer 75 >59 mL/min/1.73   BUN/Creatinine Ratio 9 (L) 10 - 24   Sodium 131 (L) 134 - 144 mmol/L   Potassium 4.8 3.5 - 5.2 mmol/L   Chloride 89 (L) 96 - 106 mmol/L   CO2 25 18 - 29 mmol/L   Calcium 10.6 (H) 8.6 - 10.2 mg/dL   Total Protein 6.9 6.0 - 8.5 g/dL   Albumin 4.5 3.6 - 4.8 g/dL   Globulin, Total 2.4 1.5 - 4.5 g/dL   Albumin/Globulin Ratio 1.9 1.2 - 2.2   Bilirubin Total 1.3 (H) 0.0 - 1.2 mg/dL   Alkaline Phosphatase 78 39 - 117 IU/L   AST 34 0 - 40 IU/L   ALT 42 0 - 44 IU/L  Lipid Panel w/o Chol/HDL Ratio  Result Value Ref Range   Cholesterol, Total 198 100 - 199 mg/dL   Triglycerides 80 0 - 149 mg/dL   HDL 83 >39 mg/dL   VLDL Cholesterol Cal 16 5 - 40 mg/dL   LDL Calculated 99 0 - 99 mg/dL      Assessment & Plan:   Problem List Items Addressed This Visit      Unprioritized   Folic acid deficiency   Relevant Orders   Folate   Hypercalcemia   Hypertension - Primary    Stable, continue present medications.        Relevant Orders   Comprehensive metabolic panel   Lipid Panel w/o Chol/HDL Ratio   Macrocytosis    Check CBC      Relevant Orders   CBC with Differential/Platelet   Polycythemia    Check CBC      Relevant Orders   CBC with Differential/Platelet   Tobacco use    Order low dose CT       Other Visit Diagnoses    Routine general medical examination at a health care facility       Relevant Orders   TSH       Follow up plan: Return if symptoms worsen or fail to improve.

## 2016-10-30 NOTE — Assessment & Plan Note (Signed)
Stable, continue present medications.   

## 2016-10-31 ENCOUNTER — Telehealth: Payer: Self-pay | Admitting: Unknown Physician Specialty

## 2016-10-31 ENCOUNTER — Telehealth: Payer: Self-pay | Admitting: *Deleted

## 2016-10-31 DIAGNOSIS — Z87891 Personal history of nicotine dependence: Secondary | ICD-10-CM

## 2016-10-31 DIAGNOSIS — N183 Chronic kidney disease, stage 3 unspecified: Secondary | ICD-10-CM

## 2016-10-31 LAB — LIPID PANEL W/O CHOL/HDL RATIO
CHOLESTEROL TOTAL: 199 mg/dL (ref 100–199)
HDL: 95 mg/dL (ref >39)
LDL CALC: 87 mg/dL (ref 0–99)
Triglycerides: 86 mg/dL (ref 0–149)
VLDL Cholesterol Cal: 17 mg/dL (ref 5–40)

## 2016-10-31 LAB — CBC WITH DIFFERENTIAL/PLATELET
BASOS: 1 %
Basophils Absolute: 0 10*3/uL (ref 0.0–0.2)
EOS (ABSOLUTE): 0.2 10*3/uL (ref 0.0–0.4)
EOS: 4 %
HEMATOCRIT: 49.6 % (ref 37.5–51.0)
HEMOGLOBIN: 17.4 g/dL (ref 13.0–17.7)
Immature Grans (Abs): 0 10*3/uL (ref 0.0–0.1)
Immature Granulocytes: 0 %
LYMPHS ABS: 1.3 10*3/uL (ref 0.7–3.1)
Lymphs: 28 %
MCH: 36.4 pg — AB (ref 26.6–33.0)
MCHC: 35.1 g/dL (ref 31.5–35.7)
MCV: 104 fL — AB (ref 79–97)
MONOCYTES: 9 %
MONOS ABS: 0.4 10*3/uL (ref 0.1–0.9)
NEUTROS ABS: 2.7 10*3/uL (ref 1.4–7.0)
Neutrophils: 58 %
Platelets: 206 10*3/uL (ref 150–379)
RBC: 4.78 x10E6/uL (ref 4.14–5.80)
RDW: 14.4 % (ref 12.3–15.4)
WBC: 4.6 10*3/uL (ref 3.4–10.8)

## 2016-10-31 LAB — FERRITIN: Ferritin: 225 ng/mL (ref 30–400)

## 2016-10-31 LAB — COMPREHENSIVE METABOLIC PANEL
A/G RATIO: 2.1 (ref 1.2–2.2)
ALBUMIN: 4.6 g/dL (ref 3.6–4.8)
ALK PHOS: 76 IU/L (ref 39–117)
ALT: 31 IU/L (ref 0–44)
AST: 32 IU/L (ref 0–40)
BUN / CREAT RATIO: 11 (ref 10–24)
BUN: 17 mg/dL (ref 8–27)
Bilirubin Total: 0.8 mg/dL (ref 0.0–1.2)
CO2: 22 mmol/L (ref 18–29)
CREATININE: 1.48 mg/dL — AB (ref 0.76–1.27)
Calcium: 10.2 mg/dL (ref 8.6–10.2)
Chloride: 89 mmol/L — ABNORMAL LOW (ref 96–106)
GFR calc Af Amer: 57 mL/min/{1.73_m2} — ABNORMAL LOW (ref >59)
GFR, EST NON AFRICAN AMERICAN: 49 mL/min/{1.73_m2} — AB (ref >59)
GLOBULIN, TOTAL: 2.2 g/dL (ref 1.5–4.5)
Glucose: 94 mg/dL (ref 65–99)
POTASSIUM: 4.1 mmol/L (ref 3.5–5.2)
SODIUM: 131 mmol/L — AB (ref 134–144)
Total Protein: 6.8 g/dL (ref 6.0–8.5)

## 2016-10-31 LAB — TSH: TSH: 2.81 u[IU]/mL (ref 0.450–4.500)

## 2016-10-31 LAB — FOLATE: FOLATE: 2.6 ng/mL — AB (ref >3.0)

## 2016-10-31 NOTE — Telephone Encounter (Signed)
Discussed with pt low GFR.  He will come for labs in the next week well hydrated.  Order entered  Start methylfolate supplementation

## 2016-10-31 NOTE — Telephone Encounter (Signed)
Received referral for initial lung cancer screening scan. Contacted patient and obtained smoking history,(current, 46 pack year) as well as answering questions related to screening process. Patient denies signs of lung cancer such as weight loss or hemoptysis. Patient denies comorbidity that would prevent curative treatment if lung cancer were found. Patient is scheduled for shared decision making visit and CT scan on 11/06/16.

## 2016-11-06 ENCOUNTER — Encounter: Payer: Self-pay | Admitting: Oncology

## 2016-11-06 ENCOUNTER — Inpatient Hospital Stay: Payer: 59 | Attending: Oncology | Admitting: Oncology

## 2016-11-06 ENCOUNTER — Ambulatory Visit
Admission: RE | Admit: 2016-11-06 | Discharge: 2016-11-06 | Disposition: A | Discharge status: Home / Self Care | Payer: 59 | Source: Ambulatory Visit | Attending: Oncology | Admitting: Oncology

## 2016-11-06 DIAGNOSIS — I700 Atherosclerosis of aorta: Secondary | ICD-10-CM | POA: Diagnosis not present

## 2016-11-06 DIAGNOSIS — Z87891 Personal history of nicotine dependence: Secondary | ICD-10-CM | POA: Insufficient documentation

## 2016-11-06 DIAGNOSIS — Z122 Encounter for screening for malignant neoplasm of respiratory organs: Secondary | ICD-10-CM

## 2016-11-06 DIAGNOSIS — I2510 Atherosclerotic heart disease of native coronary artery without angina pectoris: Secondary | ICD-10-CM | POA: Insufficient documentation

## 2016-11-06 DIAGNOSIS — F17210 Nicotine dependence, cigarettes, uncomplicated: Secondary | ICD-10-CM | POA: Insufficient documentation

## 2016-11-06 NOTE — Progress Notes (Signed)
In accordance with CMS guidelines, patient has met eligibility criteria including age, absence of signs or symptoms of lung cancer.  Social History  Substance Use Topics  . Smoking status: Current Every Day Smoker    Packs/day: 1.00    Years: 46.00    Types: Cigarettes  . Smokeless tobacco: Never Used  . Alcohol use 8.4 oz/week    14 Glasses of wine per week     A shared decision-making session was conducted prior to the performance of CT scan. This includes one or more decision aids, includes benefits and harms of screening, follow-up diagnostic testing, over-diagnosis, false positive rate, and total radiation exposure.  Counseling on the importance of adherence to annual lung cancer LDCT screening, impact of co-morbidities, and ability or willingness to undergo diagnosis and treatment is imperative for compliance of the program.  Counseling on the importance of continued smoking cessation for former smokers; the importance of smoking cessation for current smokers, and information about tobacco cessation interventions have been given to patient including [place] and [facility] quit [place] programs.  Written order for lung cancer screening with LDCT has been given to the patient and any and all questions have been answered to the best of my abilities.   Yearly follow up will be coordinated by Burgess Estelle, Thoracic Navigator.

## 2016-11-08 ENCOUNTER — Encounter: Payer: Self-pay | Admitting: *Deleted

## 2016-11-14 ENCOUNTER — Other Ambulatory Visit: Payer: 59

## 2016-11-14 DIAGNOSIS — N183 Chronic kidney disease, stage 3 unspecified: Secondary | ICD-10-CM

## 2016-11-15 ENCOUNTER — Encounter: Payer: Self-pay | Admitting: Family Medicine

## 2016-11-15 LAB — COMPREHENSIVE METABOLIC PANEL
A/G RATIO: 2.6 — AB (ref 1.2–2.2)
ALBUMIN: 4.9 g/dL — AB (ref 3.6–4.8)
ALK PHOS: 79 IU/L (ref 39–117)
ALT: 34 IU/L (ref 0–44)
AST: 40 IU/L (ref 0–40)
BUN / CREAT RATIO: 12 (ref 10–24)
BUN: 14 mg/dL (ref 8–27)
Bilirubin Total: 1.3 mg/dL — ABNORMAL HIGH (ref 0.0–1.2)
CALCIUM: 10.5 mg/dL — AB (ref 8.6–10.2)
CO2: 21 mmol/L (ref 18–29)
CREATININE: 1.16 mg/dL (ref 0.76–1.27)
Chloride: 91 mmol/L — ABNORMAL LOW (ref 96–106)
GFR calc Af Amer: 77 mL/min/{1.73_m2} (ref >59)
GFR, EST NON AFRICAN AMERICAN: 66 mL/min/{1.73_m2} (ref >59)
GLOBULIN, TOTAL: 1.9 g/dL (ref 1.5–4.5)
Glucose: 96 mg/dL (ref 65–99)
Potassium: 4.4 mmol/L (ref 3.5–5.2)
SODIUM: 131 mmol/L — AB (ref 134–144)
Total Protein: 6.8 g/dL (ref 6.0–8.5)

## 2016-12-06 ENCOUNTER — Other Ambulatory Visit: Payer: Self-pay | Admitting: Unknown Physician Specialty

## 2016-12-07 ENCOUNTER — Other Ambulatory Visit: Payer: Self-pay | Admitting: Unknown Physician Specialty

## 2017-01-10 ENCOUNTER — Other Ambulatory Visit: Payer: Self-pay | Admitting: Unknown Physician Specialty

## 2017-02-05 DIAGNOSIS — N529 Male erectile dysfunction, unspecified: Secondary | ICD-10-CM | POA: Diagnosis not present

## 2017-02-05 DIAGNOSIS — C61 Malignant neoplasm of prostate: Secondary | ICD-10-CM | POA: Diagnosis not present

## 2017-04-29 DIAGNOSIS — M531 Cervicobrachial syndrome: Secondary | ICD-10-CM | POA: Diagnosis not present

## 2017-04-29 DIAGNOSIS — M9902 Segmental and somatic dysfunction of thoracic region: Secondary | ICD-10-CM | POA: Diagnosis not present

## 2017-04-29 DIAGNOSIS — M9901 Segmental and somatic dysfunction of cervical region: Secondary | ICD-10-CM | POA: Diagnosis not present

## 2017-04-29 DIAGNOSIS — M75102 Unspecified rotator cuff tear or rupture of left shoulder, not specified as traumatic: Secondary | ICD-10-CM | POA: Diagnosis not present

## 2017-09-17 ENCOUNTER — Telehealth: Payer: Self-pay | Admitting: Unknown Physician Specialty

## 2017-09-17 ENCOUNTER — Other Ambulatory Visit: Payer: Self-pay | Admitting: *Deleted

## 2017-09-17 MED ORDER — OMEPRAZOLE 20 MG PO CPDR: 20.0000 mg | DELAYED_RELEASE_CAPSULE | Freq: Every day | ORAL | 2 refills | Status: DC

## 2017-09-17 NOTE — Telephone Encounter (Signed)
Copied from [place]. Topic: Quick Communication - Rx Refill/Question >> Sep 17, 2017 11:59 AM Billy Duncan wrote: Medication: omeprazole (PRILOSEC) 20 MG capsule Has the patient contacted their pharmacy? Yes.  Will call when we get off of phone (Agent: If no, request that the patient contact the pharmacy for the refill.) Preferred Pharmacy (with phone number or street name): [id number] - [place], [place] [phone] (Phone) [phone] (Fax)     Agent: Please be advised that RX refills may take up to 3 business days. We ask that you follow-up with your pharmacy.

## 2017-10-18 ENCOUNTER — Ambulatory Visit (INDEPENDENT_AMBULATORY_CARE_PROVIDER_SITE_OTHER): Payer: Medicare Other | Admitting: Unknown Physician Specialty

## 2017-10-18 ENCOUNTER — Encounter: Payer: Self-pay | Admitting: Unknown Physician Specialty

## 2017-10-18 VITALS — BP 132/80 | HR 99 | Temp 98.0°F | Ht 70.5 in | Wt 178.9 lb

## 2017-10-18 DIAGNOSIS — I10 Essential (primary) hypertension: Secondary | ICD-10-CM | POA: Diagnosis not present

## 2017-10-18 DIAGNOSIS — Z87891 Personal history of nicotine dependence: Secondary | ICD-10-CM

## 2017-10-18 DIAGNOSIS — K219 Gastro-esophageal reflux disease without esophagitis: Secondary | ICD-10-CM | POA: Diagnosis not present

## 2017-10-18 DIAGNOSIS — N183 Chronic kidney disease, stage 3 unspecified: Secondary | ICD-10-CM

## 2017-10-18 DIAGNOSIS — Z23 Encounter for immunization: Secondary | ICD-10-CM

## 2017-10-18 DIAGNOSIS — I7090 Unspecified atherosclerosis: Secondary | ICD-10-CM | POA: Diagnosis not present

## 2017-10-18 MED ORDER — LISINOPRIL-HYDROCHLOROTHIAZIDE 20-12.5 MG PO TABS: 1.0000 | ORAL_TABLET | Freq: Every day | ORAL | 3 refills | Status: DC

## 2017-10-18 MED ORDER — OMEPRAZOLE 20 MG PO CPDR: 20.0000 mg | DELAYED_RELEASE_CAPSULE | Freq: Every day | ORAL | 2 refills | Status: DC

## 2017-10-18 MED ORDER — ATORVASTATIN CALCIUM 20 MG PO TABS: 20.0000 mg | ORAL_TABLET | Freq: Every day | ORAL | 3 refills | Status: DC

## 2017-10-18 MED ORDER — AMLODIPINE BESYLATE 5 MG PO TABS: 5.0000 mg | ORAL_TABLET | Freq: Every day | ORAL | 3 refills | Status: DC

## 2017-10-18 NOTE — Assessment & Plan Note (Signed)
Encouraged cessation.  Not interested in quitting at this time.

## 2017-10-18 NOTE — Assessment & Plan Note (Signed)
Noted on CT.  Discussed risk reduction.  Start ASA and Atorvastatin 20 mg

## 2017-10-18 NOTE — Assessment & Plan Note (Signed)
Continue omeprazole.  Discuss titration off medication if interested.

## 2017-10-18 NOTE — Assessment & Plan Note (Signed)
Recheck GFR

## 2017-10-18 NOTE — Patient Instructions (Signed)
Pneumococcal Conjugate Vaccine (PCV13) What You Need to Know 1. Why get vaccinated? Vaccination can protect both children and adults from pneumococcal disease. Pneumococcal disease is caused by bacteria that can spread from person to person through close contact. It can cause ear infections, and it can also lead to more serious infections of the:  Lungs (pneumonia),  Blood (bacteremia), and  Covering of the brain and spinal cord (meningitis).  Pneumococcal pneumonia is most common among adults. Pneumococcal meningitis can cause deafness and brain damage, and it kills about 1 child in 10 who get it. Anyone can get pneumococcal disease, but children under 2 years of age and adults 65 years and older, people with certain medical conditions, and cigarette smokers are at the highest risk. Before there was a vaccine, the [place] saw:  more than 700 cases of meningitis,  about 13,000 blood infections,  about 5 million ear infections, and  about 200 deaths  in children under 5 each year from pneumococcal disease. Since vaccine became available, severe pneumococcal disease in these children has fallen by 88%. About 18,000 older adults die of pneumococcal disease each year in the [place]. Treatment of pneumococcal infections with penicillin and other drugs is not as effective as it used to be, because some strains of the disease have become resistant to these drugs. This makes prevention of the disease, through vaccination, even more important. 2. PCV13 vaccine Pneumococcal conjugate vaccine (called PCV13) protects against 13 types of pneumococcal bacteria. PCV13 is routinely given to children at 2, 4, 6, and 12-15 months of age. It is also recommended for children and adults 2 to 64 years of age with certain health conditions, and for all adults 65 years of age and older. Your doctor can give you details. 3. Some people should not get this vaccine Anyone who has ever had a  life-threatening allergic reaction to a dose of this vaccine, to an earlier pneumococcal vaccine called PCV7, or to any vaccine containing diphtheria toxoid (for example, DTaP), should not get PCV13. Anyone with a severe allergy to any component of PCV13 should not get the vaccine. Tell your doctor if the person being vaccinated has any severe allergies. If the person scheduled for vaccination is not feeling well, your healthcare provider might decide to reschedule the shot on another day. 4. Risks of a vaccine reaction With any medicine, including vaccines, there is a chance of reactions. These are usually mild and go away on their own, but serious reactions are also possible. Problems reported following PCV13 varied by age and dose in the series. The most common problems reported among children were:  About half became drowsy after the shot, had a temporary loss of appetite, or had redness or tenderness where the shot was given.  About 1 out of 3 had swelling where the shot was given.  About 1 out of 3 had a mild fever, and about 1 in 20 had a fever over 102.2F.  Up to about 8 out of 10 became fussy or irritable.  Adults have reported pain, redness, and swelling where the shot was given; also mild fever, fatigue, headache, chills, or muscle pain. Young children who get PCV13 along with inactivated flu vaccine at the same time may be at increased risk for seizures caused by fever. Ask your doctor for more information. Problems that could happen after any vaccine:  People sometimes faint after a medical procedure, including vaccination. Sitting or lying down for about 15 minutes can help prevent   fainting, and injuries caused by a fall. Tell your doctor if you feel dizzy, or have vision changes or ringing in the ears.  Some older children and adults get severe pain in the shoulder and have difficulty moving the arm where a shot was given. This happens very rarely.  Any medication can cause a  severe allergic reaction. Such reactions from a vaccine are very rare, estimated at about 1 in a million doses, and would happen within a few minutes to a few hours after the vaccination. As with any medicine, there is a very small chance of a vaccine causing a serious injury or death. The safety of vaccines is always being monitored. For more information, visit: www.cdc.gov/vaccinesafety/ 5. What if there is a serious reaction? What should I look for? Look for anything that concerns you, such as signs of a severe allergic reaction, very high fever, or unusual behavior. Signs of a severe allergic reaction can include hives, swelling of the face and throat, difficulty breathing, a fast heartbeat, dizziness, and weakness-usually within a few minutes to a few hours after the vaccination. What should I do?  If you think it is a severe allergic reaction or other emergency that can't wait, call 9-1-1 or get the person to the nearest hospital. Otherwise, call your doctor.  Reactions should be reported to the Vaccine Adverse Event Reporting System (VAERS). Your doctor should file this report, or you can do it yourself through the VAERS web site at www.vaers.hhs.gov, or by calling 1-800-822-7967. ? VAERS does not give medical advice. 6. The National Vaccine Injury Compensation Program The National Vaccine Injury Compensation Program (VICP) is a federal program that was created to compensate people who may have been injured by certain vaccines. Persons who believe they may have been injured by a vaccine can learn about the program and about filing a claim by calling 1-800-338-2382 or visiting the VICP website at www.hrsa.gov/vaccinecompensation. There is a time limit to file a claim for compensation. 7. How can I learn more?  Ask your healthcare provider. He or she can give you the vaccine package insert or suggest other sources of information.  Call your local or state health department.  Contact the  Centers for Disease Control and Prevention (CDC): ? Call 1-800-232-4636 (1-800-CDC-INFO) or ? Visit CDC's website at www.cdc.gov/vaccines Vaccine Information Statement, PCV13 Vaccine (04/29/2014) This information is not intended to replace advice given to you by your health care provider. Make sure you discuss any questions you have with your health care provider. Document Released: 04/08/2006 Document Revised: 03/01/2016 Document Reviewed: 03/01/2016 Elsevier Interactive Patient Education  2017 Elsevier Inc.  

## 2017-10-18 NOTE — Progress Notes (Signed)
BP 132/80   Pulse 99   Temp 98 F (36.7 C) (Oral)   Ht 5' 10.5" (1.791 m)   Wt 178 lb 14.4 oz (81.1 kg)   SpO2 96%   BMI 25.31 kg/m    Subjective:    Patient ID: Billy Duncan, male    DOB: 1951-03-09, 67 y.o.   MRN: [id number]  HPI: Billy Duncan is a 67 y.o. male  Chief Complaint  Patient presents with  . Hypertension   Hypertension Using medications without difficulty Average home BPs   No problems or lightheadedness No chest pain with exertion or shortness of breath No Edema  Hyperlipidemia Discussed ASCVD risk plus arteriosclerosis on CT scan.  Does not want to take additional medications.          The 10-year ASCVD risk score Mikey Bussing DC Brooke Bonito., et al., 2013) is: 14.9%   Values used to calculate the score:     Age: 49 years     Sex: Male     Is Non-Hispanic African American: No     Diabetic: No     Tobacco smoker: Yes     Systolic Blood Pressure: 710 mmHg     Is BP treated: Yes     HDL Cholesterol: 95 mg/dL     Total Cholesterol: 199 mg/dL  GERD Pt states he tried to stop and unable to.  Would like to continue  CKD Last GFR was 49.  Needs recheck  Relevant past medical, surgical, family and social history reviewed and updated as indicated. Interim medical history since our last visit reviewed. Allergies and medications reviewed and updated.  Review of Systems  Per HPI unless specifically indicated above     Objective:    BP 132/80   Pulse 99   Temp 98 F (36.7 C) (Oral)   Ht 5' 10.5" (1.791 m)   Wt 178 lb 14.4 oz (81.1 kg)   SpO2 96%   BMI 25.31 kg/m   Wt Readings from Last 3 Encounters:  10/18/17 178 lb 14.4 oz (81.1 kg)  11/06/16 182 lb (82.6 kg)  10/30/16 182 lb 3.2 oz (82.6 kg)    Physical Exam  Constitutional: He is oriented to person, place, and time. He appears well-developed and well-nourished. No distress.  HENT:  Head: Normocephalic and atraumatic.  Eyes: Conjunctivae and lids are normal. Right eye exhibits no discharge.  Left eye exhibits no discharge. No scleral icterus.  Neck: Normal range of motion. Neck supple. No JVD present. Carotid bruit is not present.  Cardiovascular: Normal rate, regular rhythm and normal heart sounds.  Pulmonary/Chest: Effort normal and breath sounds normal. No respiratory distress.  Abdominal: Normal appearance. There is no splenomegaly or hepatomegaly.  Musculoskeletal: Normal range of motion.  Neurological: He is alert and oriented to person, place, and time.  Skin: Skin is warm, dry and intact. No rash noted. No pallor.  Psychiatric: He has a normal mood and affect. His behavior is normal. Judgment and thought content normal.    Results for orders placed or performed in visit on 11/14/16  Comprehensive metabolic panel  Result Value Ref Range   Glucose 96 65 - 99 mg/dL   BUN 14 8 - 27 mg/dL   Creatinine, Ser 1.16 0.76 - 1.27 mg/dL   GFR calc non Af Amer 66 >59 mL/min/1.73   GFR calc Af Amer 77 >59 mL/min/1.73   BUN/Creatinine Ratio 12 10 - 24   Sodium 131 (L) 134 - 144 mmol/L   Potassium  4.4 3.5 - 5.2 mmol/L   Chloride 91 (L) 96 - 106 mmol/L   CO2 21 18 - 29 mmol/L   Calcium 10.5 (H) 8.6 - 10.2 mg/dL   Total Protein 6.8 6.0 - 8.5 g/dL   Albumin 4.9 (H) 3.6 - 4.8 g/dL   Globulin, Total 1.9 1.5 - 4.5 g/dL   Albumin/Globulin Ratio 2.6 (H) 1.2 - 2.2   Bilirubin Total 1.3 (H) 0.0 - 1.2 mg/dL   Alkaline Phosphatase 79 39 - 117 IU/L   AST 40 0 - 40 IU/L   ALT 34 0 - 44 IU/L      Assessment & Plan:   Problem List Items Addressed This Visit      Unprioritized   Arteriosclerosis    Noted on CT.  Discussed risk reduction.  Start ASA and Atorvastatin 20 mg      Relevant Medications   amLODipine (NORVASC) 5 MG tablet   atorvastatin (LIPITOR) 20 MG tablet   lisinopril-hydrochlorothiazide (PRINZIDE,ZESTORETIC) 20-12.5 MG tablet   Other Relevant Orders   Comprehensive metabolic panel   GERD (gastroesophageal reflux disease)    Continue omeprazole.  Discuss titration  off medication if interested.        Relevant Medications   omeprazole (PRILOSEC) 20 MG capsule   Hypertension    Stable, continue present medications.        Relevant Medications   amLODipine (NORVASC) 5 MG tablet   atorvastatin (LIPITOR) 20 MG tablet   lisinopril-hydrochlorothiazide (PRINZIDE,ZESTORETIC) 20-12.5 MG tablet   Other Relevant Orders   Comprehensive metabolic panel   Personal history of tobacco use, presenting hazards to health    Encouraged cessation.  Not interested in quitting at this time.        Stage 3 chronic kidney disease (HCC)    Recheck GFR       Other Visit Diagnoses    Need for pneumococcal vaccination    -  Primary   Relevant Orders   Pneumococcal conjugate vaccine 13-valent IM (Completed)       Follow up plan: Return in about 3 months (around 01/17/2018).

## 2017-10-18 NOTE — Assessment & Plan Note (Signed)
Stable, continue present medications.   

## 2017-10-19 LAB — COMPREHENSIVE METABOLIC PANEL
ALBUMIN: 4.5 g/dL (ref 3.6–4.8)
ALT: 27 IU/L (ref 0–44)
AST: 32 IU/L (ref 0–40)
Albumin/Globulin Ratio: 2.1 (ref 1.2–2.2)
Alkaline Phosphatase: 69 IU/L (ref 39–117)
BILIRUBIN TOTAL: 1 mg/dL (ref 0.0–1.2)
BUN / CREAT RATIO: 11 (ref 10–24)
BUN: 13 mg/dL (ref 8–27)
CALCIUM: 10 mg/dL (ref 8.6–10.2)
CHLORIDE: 91 mmol/L — AB (ref 96–106)
CO2: 19 mmol/L — AB (ref 20–29)
CREATININE: 1.14 mg/dL (ref 0.76–1.27)
GFR, EST AFRICAN AMERICAN: 78 mL/min/{1.73_m2} (ref >59)
GFR, EST NON AFRICAN AMERICAN: 67 mL/min/{1.73_m2} (ref >59)
GLUCOSE: 82 mg/dL (ref 65–99)
Globulin, Total: 2.1 g/dL (ref 1.5–4.5)
Potassium: 4.2 mmol/L (ref 3.5–5.2)
Sodium: 129 mmol/L — ABNORMAL LOW (ref 134–144)
TOTAL PROTEIN: 6.6 g/dL (ref 6.0–8.5)

## 2017-10-21 ENCOUNTER — Encounter: Payer: Self-pay | Admitting: Unknown Physician Specialty

## 2017-11-04 ENCOUNTER — Telehealth: Payer: Self-pay | Admitting: *Deleted

## 2017-11-04 DIAGNOSIS — Z87891 Personal history of nicotine dependence: Secondary | ICD-10-CM

## 2017-11-04 DIAGNOSIS — Z122 Encounter for screening for malignant neoplasm of respiratory organs: Secondary | ICD-10-CM

## 2017-11-04 NOTE — Telephone Encounter (Signed)
Notified patient that annual lung cancer screening low dose CT scan is due currently or will be in near future. Confirmed that patient is within the age range of 55-77, and asymptomatic, (no signs or symptoms of lung cancer). Patient denies illness that would prevent curative treatment for lung cancer if found. Verified smoking history, (current, 47 pack year). The shared decision making visit was done 11/06/16. Patient is agreeable for CT scan being scheduled.

## 2017-11-08 ENCOUNTER — Ambulatory Visit
Admission: RE | Admit: 2017-11-08 | Discharge: 2017-11-08 | Disposition: A | Discharge status: Home / Self Care | Payer: Medicare Other | Source: Ambulatory Visit | Attending: Nurse Practitioner | Admitting: Nurse Practitioner

## 2017-11-08 DIAGNOSIS — J438 Other emphysema: Secondary | ICD-10-CM | POA: Diagnosis not present

## 2017-11-08 DIAGNOSIS — Z122 Encounter for screening for malignant neoplasm of respiratory organs: Secondary | ICD-10-CM

## 2017-11-08 DIAGNOSIS — Z87891 Personal history of nicotine dependence: Secondary | ICD-10-CM | POA: Diagnosis not present

## 2017-11-08 DIAGNOSIS — F17210 Nicotine dependence, cigarettes, uncomplicated: Secondary | ICD-10-CM | POA: Diagnosis not present

## 2017-11-08 DIAGNOSIS — I700 Atherosclerosis of aorta: Secondary | ICD-10-CM | POA: Diagnosis not present

## 2017-11-08 DIAGNOSIS — I2510 Atherosclerotic heart disease of native coronary artery without angina pectoris: Secondary | ICD-10-CM | POA: Diagnosis not present

## 2017-11-11 ENCOUNTER — Encounter: Payer: Self-pay | Admitting: *Deleted

## 2018-01-17 ENCOUNTER — Ambulatory Visit (INDEPENDENT_AMBULATORY_CARE_PROVIDER_SITE_OTHER): Payer: Medicare Other | Admitting: Unknown Physician Specialty

## 2018-01-17 ENCOUNTER — Encounter: Payer: Self-pay | Admitting: Unknown Physician Specialty

## 2018-01-17 VITALS — BP 121/74 | HR 85 | Temp 97.7°F | Ht 70.5 in | Wt 177.2 lb

## 2018-01-17 DIAGNOSIS — I10 Essential (primary) hypertension: Secondary | ICD-10-CM

## 2018-01-17 DIAGNOSIS — E871 Hypo-osmolality and hyponatremia: Secondary | ICD-10-CM

## 2018-01-17 DIAGNOSIS — I7090 Unspecified atherosclerosis: Secondary | ICD-10-CM | POA: Diagnosis not present

## 2018-01-17 NOTE — Progress Notes (Signed)
BP 121/74   Pulse 85   Temp 97.7 F (36.5 C) (Oral)   Ht 5' 10.5" (1.791 m)   Wt 177 lb 3.2 oz (80.4 kg)   SpO2 98%   BMI 25.07 kg/m    Subjective:    Patient ID: Billy Duncan, male    DOB: [birth year], 67 y.o.   MRN: [id number]  HPI: Billy Duncan is a 67 y.o. male  Chief Complaint  Patient presents with  . Hypertension  . Hyperlipidemia   Hyperlipidemia Started Atorvastatin last visit.  Managing well without complaints No Muscle aches  Diet compliance:Exercise: States stable diet and exercises regularly  Relevant past medical, surgical, family and social history reviewed and updated as indicated. Interim medical history since our last visit reviewed. Allergies and medications reviewed and updated.  Review of Systems  Per HPI unless specifically indicated above     Objective:    BP 121/74   Pulse 85   Temp 97.7 F (36.5 C) (Oral)   Ht 5' 10.5" (1.791 m)   Wt 177 lb 3.2 oz (80.4 kg)   SpO2 98%   BMI 25.07 kg/m   Wt Readings from Last 3 Encounters:  01/17/18 177 lb 3.2 oz (80.4 kg)  11/08/17 185 lb (83.9 kg)  10/18/17 178 lb 14.4 oz (81.1 kg)    Physical Exam  Constitutional: He is oriented to person, place, and time. He appears well-developed and well-nourished. No distress.  HENT:  Head: Normocephalic and atraumatic.  Eyes: Conjunctivae and lids are normal. Right eye exhibits no discharge. Left eye exhibits no discharge. No scleral icterus.  Neck: Normal range of motion. Neck supple. No JVD present. Carotid bruit is not present.  Cardiovascular: Normal rate, regular rhythm and normal heart sounds.  Pulmonary/Chest: Effort normal and breath sounds normal. No respiratory distress.  Abdominal: Normal appearance. There is no splenomegaly or hepatomegaly.  Musculoskeletal: Normal range of motion.  Neurological: He is alert and oriented to person, place, and time.  Skin: Skin is warm, dry and intact. No rash noted. No pallor.  Psychiatric: He has a  normal mood and affect. His behavior is normal. Judgment and thought content normal.    Results for orders placed or performed in visit on 10/18/17  Comprehensive metabolic panel  Result Value Ref Range   Glucose 82 65 - 99 mg/dL   BUN 13 8 - 27 mg/dL   Creatinine, Ser 1.14 0.76 - 1.27 mg/dL   GFR calc non Af Amer 67 >59 mL/min/1.73   GFR calc Af Amer 78 >59 mL/min/1.73   BUN/Creatinine Ratio 11 10 - 24   Sodium 129 (L) 134 - 144 mmol/L   Potassium 4.2 3.5 - 5.2 mmol/L   Chloride 91 (L) 96 - 106 mmol/L   CO2 19 (L) 20 - 29 mmol/L   Calcium 10.0 8.6 - 10.2 mg/dL   Total Protein 6.6 6.0 - 8.5 g/dL   Albumin 4.5 3.6 - 4.8 g/dL   Globulin, Total 2.1 1.5 - 4.5 g/dL   Albumin/Globulin Ratio 2.1 1.2 - 2.2   Bilirubin Total 1.0 0.0 - 1.2 mg/dL   Alkaline Phosphatase 69 39 - 117 IU/L   AST 32 0 - 40 IU/L   ALT 27 0 - 44 IU/L      Assessment & Plan:   Problem List Items Addressed This Visit      Unprioritized   Arteriosclerosis   Relevant Medications   aspirin EC 81 MG tablet   Other Relevant Orders  Lipid Panel Piccolo, Waived   Comprehensive metabolic panel   Hypertension - Primary   Relevant Medications   aspirin EC 81 MG tablet   Other Relevant Orders   Lipid Panel Piccolo, Waived   Comprehensive metabolic panel   Lipid Panel w/o Chol/HDL Ratio    Other Visit Diagnoses    Hyponatremia       Relevant Orders   Comprehensive metabolic panel       Follow up plan: Return in about 6 months (around 07/20/2018).

## 2018-01-18 LAB — COMPREHENSIVE METABOLIC PANEL
A/G RATIO: 2.4 — AB (ref 1.2–2.2)
ALBUMIN: 4.6 g/dL (ref 3.6–4.8)
ALK PHOS: 65 IU/L (ref 39–117)
ALT: 29 IU/L (ref 0–44)
AST: 33 IU/L (ref 0–40)
BUN / CREAT RATIO: 9 — AB (ref 10–24)
BUN: 11 mg/dL (ref 8–27)
Bilirubin Total: 1.3 mg/dL — ABNORMAL HIGH (ref 0.0–1.2)
CO2: 19 mmol/L — AB (ref 20–29)
Calcium: 9.9 mg/dL (ref 8.6–10.2)
Chloride: 92 mmol/L — ABNORMAL LOW (ref 96–106)
Creatinine, Ser: 1.2 mg/dL (ref 0.76–1.27)
GFR calc Af Amer: 72 mL/min/{1.73_m2} (ref >59)
GFR calc non Af Amer: 63 mL/min/{1.73_m2} (ref >59)
GLOBULIN, TOTAL: 1.9 g/dL (ref 1.5–4.5)
Glucose: 86 mg/dL (ref 65–99)
POTASSIUM: 4 mmol/L (ref 3.5–5.2)
SODIUM: 128 mmol/L — AB (ref 134–144)
Total Protein: 6.5 g/dL (ref 6.0–8.5)

## 2018-01-18 LAB — LIPID PANEL PICCOLO, WAIVED
CHOLESTEROL PICCOLO, WAIVED: 173 mg/dL (ref <200)
TRIGLYCERIDES PICCOLO,WAIVED: 90 mg/dL (ref <150)
VLDL Chol Calc Piccolo,Waive: 18 mg/dL (ref <30)

## 2018-01-18 LAB — LIPID PANEL W/O CHOL/HDL RATIO
Cholesterol, Total: 177 mg/dL (ref 100–199)
HDL: 115 mg/dL (ref >39)
LDL Calculated: 47 mg/dL (ref 0–99)
Triglycerides: 73 mg/dL (ref 0–149)
VLDL CHOLESTEROL CAL: 15 mg/dL (ref 5–40)

## 2018-01-20 ENCOUNTER — Telehealth: Payer: Self-pay | Admitting: Unknown Physician Specialty

## 2018-01-20 MED ORDER — LISINOPRIL 20 MG PO TABS: 20.0000 mg | ORAL_TABLET | Freq: Every day | ORAL | 3 refills | Status: DC

## 2018-01-20 NOTE — Telephone Encounter (Signed)
Discussed low sodium with pt.  Stop HCTZ.  Recheck in 4 weeks

## 2018-01-20 NOTE — Telephone Encounter (Signed)
Left message to call back about labs

## 2018-01-21 NOTE — Telephone Encounter (Signed)
LVM for patient to calll to schedule appt to have sodium levels rechecked in 4 weeks per Malachy Mood.  Can be scheduled with Fabio Bering or Apolonio Schneiders

## 2018-02-19 ENCOUNTER — Ambulatory Visit: Payer: Medicare Other | Admitting: Physician Assistant

## 2018-02-20 ENCOUNTER — Ambulatory Visit (INDEPENDENT_AMBULATORY_CARE_PROVIDER_SITE_OTHER): Payer: Medicare Other

## 2018-02-20 ENCOUNTER — Encounter: Payer: Self-pay | Admitting: Physician Assistant

## 2018-02-20 ENCOUNTER — Ambulatory Visit (INDEPENDENT_AMBULATORY_CARE_PROVIDER_SITE_OTHER): Payer: Medicare Other | Admitting: Physician Assistant

## 2018-02-20 VITALS — BP 132/64 | HR 72 | Temp 98.2°F | Ht 71.5 in | Wt 177.2 lb

## 2018-02-20 VITALS — BP 132/64 | HR 72 | Temp 98.2°F | Resp 16 | Ht 71.5 in | Wt 177.2 lb

## 2018-02-20 DIAGNOSIS — Z23 Encounter for immunization: Secondary | ICD-10-CM

## 2018-02-20 DIAGNOSIS — I10 Essential (primary) hypertension: Secondary | ICD-10-CM | POA: Diagnosis not present

## 2018-02-20 DIAGNOSIS — E871 Hypo-osmolality and hyponatremia: Secondary | ICD-10-CM

## 2018-02-20 DIAGNOSIS — Z0000 Encounter for general adult medical examination without abnormal findings: Secondary | ICD-10-CM | POA: Diagnosis not present

## 2018-02-20 NOTE — Patient Instructions (Signed)

## 2018-02-20 NOTE — Patient Instructions (Addendum)
Billy Duncan , Thank you for taking time to come for your Medicare Wellness Visit. I appreciate your ongoing commitment to your health goals. Please review the following plan we discussed and let me know if I can assist you in the future.   Screening recommendations/referrals: Colonoscopy: completed 09/13/2014 Recommended yearly ophthalmology/optometry visit for glaucoma screening and checkup Recommended yearly dental visit for hygiene and checkup  Vaccinations: Influenza vaccine: due 03/2018 Pneumococcal vaccine: pneumovax 23 due 10/17/2018 Tdap vaccine: up to date  Shingles vaccine: shingrix eligible, check with your insurance company for coverage    Advanced directives: Advance directive discussed with you today. Even though you declined this today please call our office should you change your mind and we can give you the proper paperwork for you to fill out.  Conditions/risks identified: smoking cessation discussed  Next appointment: Follow up in one year for your annual wellness exam.   Preventive Care 65 Years and Older, Male Preventive care refers to lifestyle choices and visits with your health care provider that can promote health and wellness. What does preventive care include?  A yearly physical exam. This is also called an annual well check.  Dental exams once or twice a year.  Routine eye exams. Ask your health care provider how often you should have your eyes checked.  Personal lifestyle choices, including:  Daily care of your teeth and gums.  Regular physical activity.  Eating a healthy diet.  Avoiding tobacco and drug use.  Limiting alcohol use.  Practicing safe sex.  Taking low doses of aspirin every day.  Taking vitamin and mineral supplements as recommended by your health care provider. What happens during an annual well check? The services and screenings done by your health care provider during your annual well check will depend on your age, overall  health, lifestyle risk factors, and family history of disease. Counseling  Your health care provider may ask you questions about your:  Alcohol use.  Tobacco use.  Drug use.  Emotional well-being.  Home and relationship well-being.  Sexual activity.  Eating habits.  History of falls.  Memory and ability to understand (cognition).  Work and work Statistician. Screening  You may have the following tests or measurements:  Height, weight, and BMI.  Blood pressure.  Lipid and cholesterol levels. These may be checked every 5 years, or more frequently if you are over 69 years old.  Skin check.  Lung cancer screening. You may have this screening every year starting at age 8 if you have a 30-pack-year history of smoking and currently smoke or have quit within the past 15 years.  Fecal occult blood test (FOBT) of the stool. You may have this test every year starting at age 39.  Flexible sigmoidoscopy or colonoscopy. You may have a sigmoidoscopy every 5 years or a colonoscopy every 10 years starting at age 3.  Prostate cancer screening. Recommendations will vary depending on your family history and other risks.  Hepatitis C blood test.  Hepatitis B blood test.  Sexually transmitted disease (STD) testing.  Diabetes screening. This is done by checking your blood sugar (glucose) after you have not eaten for a while (fasting). You may have this done every 1-3 years.  Abdominal aortic aneurysm (AAA) screening. You may need this if you are a current or former smoker.  Osteoporosis. You may be screened starting at age 28 if you are at high risk. Talk with your health care provider about your test results, treatment options, and if necessary,  the need for more tests. Vaccines  Your health care provider may recommend certain vaccines, such as:  Influenza vaccine. This is recommended every year.  Tetanus, diphtheria, and acellular pertussis (Tdap, Td) vaccine. You may need a Td  booster every 10 years.  Zoster vaccine. You may need this after age 53.  Pneumococcal 13-valent conjugate (PCV13) vaccine. One dose is recommended after age 70.  Pneumococcal polysaccharide (PPSV23) vaccine. One dose is recommended after age 14. Talk to your health care provider about which screenings and vaccines you need and how often you need them. This information is not intended to replace advice given to you by your health care provider. Make sure you discuss any questions you have with your health care provider. Document Released: 07/08/2015 Document Revised: 02/29/2016 Document Reviewed: 04/12/2015 Elsevier Interactive Patient Education  2017 [place] Prevention in the Home Falls can cause injuries. They can happen to people of all ages. There are many things you can do to make your home safe and to help prevent falls. What can I do on the outside of my home?  Regularly fix the edges of walkways and driveways and fix any cracks.  Remove anything that might make you trip as you walk through a door, such as a raised step or threshold.  Trim any bushes or trees on the path to your home.  Use bright outdoor lighting.  Clear any walking paths of anything that might make someone trip, such as rocks or tools.  Regularly check to see if handrails are loose or broken. Make sure that both sides of any steps have handrails.  Any raised decks and porches should have guardrails on the edges.  Have any leaves, snow, or ice cleared regularly.  Use sand or salt on walking paths during winter.  Clean up any spills in your garage right away. This includes oil or grease spills. What can I do in the bathroom?  Use night lights.  Install grab bars by the toilet and in the tub and shower. Do not use towel bars as grab bars.  Use non-skid mats or decals in the tub or shower.  If you need to sit down in the shower, use a plastic, non-slip stool.  Keep the floor dry. Clean up  any water that spills on the floor as soon as it happens.  Remove soap buildup in the tub or shower regularly.  Attach bath mats securely with double-sided non-slip rug tape.  Do not have throw rugs and other things on the floor that can make you trip. What can I do in the bedroom?  Use night lights.  Make sure that you have a light by your bed that is easy to reach.  Do not use any sheets or blankets that are too big for your bed. They should not hang down onto the floor.  Have a firm chair that has side arms. You can use this for support while you get dressed.  Do not have throw rugs and other things on the floor that can make you trip. What can I do in the kitchen?  Clean up any spills right away.  Avoid walking on wet floors.  Keep items that you use a lot in easy-to-reach places.  If you need to reach something above you, use a strong step stool that has a grab bar.  Keep electrical cords out of the way.  Do not use floor polish or wax that makes floors slippery. If you must use  wax, use non-skid floor wax.  Do not have throw rugs and other things on the floor that can make you trip. What can I do with my stairs?  Do not leave any items on the stairs.  Make sure that there are handrails on both sides of the stairs and use them. Fix handrails that are broken or loose. Make sure that handrails are as long as the stairways.  Check any carpeting to make sure that it is firmly attached to the stairs. Fix any carpet that is loose or worn.  Avoid having throw rugs at the top or bottom of the stairs. If you do have throw rugs, attach them to the floor with carpet tape.  Make sure that you have a light switch at the top of the stairs and the bottom of the stairs. If you do not have them, ask someone to add them for you. What else can I do to help prevent falls?  Wear shoes that:  Do not have high heels.  Have rubber bottoms.  Are comfortable and fit you well.  Are  closed at the toe. Do not wear sandals.  If you use a stepladder:  Make sure that it is fully opened. Do not climb a closed stepladder.  Make sure that both sides of the stepladder are locked into place.  Ask someone to hold it for you, if possible.  Clearly mark and make sure that you can see:  Any grab bars or handrails.  First and last steps.  Where the edge of each step is.  Use tools that help you move around (mobility aids) if they are needed. These include:  Canes.  Walkers.  Scooters.  Crutches.  Turn on the lights when you go into a dark area. Replace any light bulbs as soon as they burn out.  Set up your furniture so you have a clear path. Avoid moving your furniture around.  If any of your floors are uneven, fix them.  If there are any pets around you, be aware of where they are.  Review your medicines with your doctor. Some medicines can make you feel dizzy. This can increase your chance of falling. Ask your doctor what other things that you can do to help prevent falls. This information is not intended to replace advice given to you by your health care provider. Make sure you discuss any questions you have with your health care provider. Document Released: 04/07/2009 Document Revised: 11/17/2015 Document Reviewed: 07/16/2014 Elsevier Interactive Patient Education  2017 [employer].   Steps to Quit Smoking Smoking tobacco can be bad for your health. It can also affect almost every organ in your body. Smoking puts you and people around you at risk for many serious long-lasting (chronic) diseases. Quitting smoking is hard, but it is one of the best things that you can do for your health. It is never too late to quit. What are the benefits of quitting smoking? When you quit smoking, you lower your risk for getting serious diseases and conditions. They can include:  Lung cancer or lung disease.  Heart disease.  Stroke.  Heart attack.  Not being able  to have children (infertility).  Weak bones (osteoporosis) and broken bones (fractures).  If you have coughing, wheezing, and shortness of breath, those symptoms may get better when you quit. You may also get sick less often. If you are pregnant, quitting smoking can help to lower your chances of having a baby of low birth weight.  What can I do to help me quit smoking? Talk with your doctor about what can help you quit smoking. Some things you can do (strategies) include:  Quitting smoking totally, instead of slowly cutting back how much you smoke over a period of time.  Going to in-person counseling. You are more likely to quit if you go to many counseling sessions.  Using resources and support systems, such as: ? Database administrator with a Social worker. ? Phone quitlines. ? Careers information officer. ? Support groups or group counseling. ? Text messaging programs. ? Mobile phone apps or applications.  Taking medicines. Some of these medicines may have nicotine in them. If you are pregnant or breastfeeding, do not take any medicines to quit smoking unless your doctor says it is okay. Talk with your doctor about counseling or other things that can help you.  Talk with your doctor about using more than one strategy at the same time, such as taking medicines while you are also going to in-person counseling. This can help make quitting easier. What things can I do to make it easier to quit? Quitting smoking might feel very hard at first, but there is a lot that you can do to make it easier. Take these steps:  Talk to your family and friends. Ask them to support and encourage you.  Call phone quitlines, reach out to support groups, or work with a Social worker.  Ask people who smoke to not smoke around you.  Avoid places that make you want (trigger) to smoke, such as: ? Bars. ? Parties. ? Smoke-break areas at work.  Spend time with people who do not smoke.  Lower the stress in your life.  Stress can make you want to smoke. Try these things to help your stress: ? Getting regular exercise. ? Deep-breathing exercises. ? Yoga. ? Meditating. ? Doing a body scan. To do this, close your eyes, focus on one area of your body at a time from head to toe, and notice which parts of your body are tense. Try to relax the muscles in those areas.  Download or buy apps on your mobile phone or tablet that can help you stick to your quit plan. There are many free apps, such as QuitGuide from the [employer] Office manager for Disease Control and Prevention). You can find more support from smokefree.gov and other websites.  This information is not intended to replace advice given to you by your health care provider. Make sure you discuss any questions you have with your health care provider. Document Released: 04/07/2009 Document Revised: 02/07/2016 Document Reviewed: 10/26/2014 Elsevier Interactive Patient Education  2018 [employer].

## 2018-02-20 NOTE — Progress Notes (Signed)
Subjective:   Billy Duncan is a 67 y.o. male who presents for an Initial Medicare Annual Wellness Visit.  Review of Systems   Cardiac Risk Factors include: hypertension;male gender;advanced age (>17men, >65 women);dyslipidemia;smoking/ tobacco exposure    Objective:    Today's Vitals   02/20/18 1010  BP: 132/64  Pulse: 72  Resp: 16  Temp: 98.2 F (36.8 C)  TempSrc: Temporal  SpO2: 98%  Weight: 177 lb 3.2 oz (80.4 kg)  Height: 5' 11.5" (1.816 m)   Body mass index is 24.37 kg/m.  Advanced Directives 02/20/2018  Does Patient Have a Medical Advance Directive? No  Would patient like information on creating a medical advance directive? No - Patient declined    Current Medications (verified) Outpatient Encounter Medications as of 02/20/2018  Medication Sig  . amLODipine (NORVASC) 5 MG tablet Take 1 tablet (5 mg total) by mouth daily.  Marland Kitchen aspirin EC 81 MG tablet Take 81 mg by mouth daily.  Marland Kitchen atorvastatin (LIPITOR) 20 MG tablet Take 1 tablet (20 mg total) by mouth daily.  Marland Kitchen lisinopril (PRINIVIL,ZESTRIL) 20 MG tablet Take 1 tablet (20 mg total) by mouth daily.  Marland Kitchen omeprazole (PRILOSEC) 20 MG capsule Take 1 capsule (20 mg total) by mouth daily.  Marland Kitchen UNABLE TO FIND Folate 1333 MCG DFE 800 MCG  . sildenafil (REVATIO) 20 MG tablet Take 20 mg by mouth daily as needed.   No facility-administered encounter medications on file as of 02/20/2018.     Allergies (verified) Patient has no known allergies.   History: Past Medical History:  Diagnosis Date  . Hypertension   . 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 Polycythemia   . Tobacco use    Past Surgical History:  Procedure Laterality Date  . FOOT NEUROMA SURGERY Left   . PROSTATECTOMY  03/2012   Family History  Problem Relation Age of Onset  . Heart disease Mother        a-fib  . Dementia Mother   . Stomach cancer Father   . Heart disease Brother   . Heart disease Brother    Social History   Socioeconomic History    . Marital status: Divorced    Spouse name: Not on file  . Number of children: 2  . Years of education: Not on file  . Highest education level: Some college, no degree  Occupational History  . Not on file  Social Needs  . Financial resource strain: Not hard at all  . Food insecurity:    Worry: Never true    Inability: Never true  . Transportation needs:    Medical: No    Non-medical: No  Tobacco Use  . Smoking status: Current Every Day Smoker    Packs/day: 1.00    Years: 46.00    Pack years: 46.00    Types: Cigarettes  . Smokeless tobacco: Never Used  Substance and Sexual Activity  . Alcohol use: Yes    Alcohol/week: 14.0 standard drinks    Types: 14 Glasses of wine per week  . Drug use: No  . Sexual activity: Yes    Comment: pt states very seldem  Lifestyle  . Physical activity:    Days per week: 5 days    Minutes per session: 60 min  . Stress: Not at all  Relationships  . Social connections:    Talks on phone: More than three times a week    Gets together: More than three times a week    Attends religious  service: Never    Active member of club or organization: No    Attends meetings of clubs or organizations: Never    Relationship status: Divorced  Other Topics Concern  . Not on file  Social History Narrative  . Not on file   Tobacco Counseling Ready to quit: No Counseling given: Yes   Clinical Intake:  Pre-visit preparation completed: Yes  Pain : No/denies pain     Nutritional Status: BMI of 19-24  Normal Nutritional Risks: None Diabetes: No  How often do you need to have someone help you when you read instructions, pamphlets, or other written materials from your doctor or pharmacy?: 1 - Never What is the last grade level you completed in school?: some college  Interpreter Needed?: No  Information entered by :: Tiffany Hill,LPN   Activities of Daily Living In your present state of health, do you have any difficulty performing the following  activities: 02/20/2018 01/17/2018  Hearing? N N  Vision? N N  Difficulty concentrating or making decisions? N N  Walking or climbing stairs? N N  Dressing or bathing? N N  Doing errands, shopping? N N  Preparing Food and eating ? N -  Using the Toilet? N -  In the past six months, have you accidently leaked urine? N -  Do you have problems with loss of bowel control? N -  Managing your Medications? N -  Managing your Finances? N -  Housekeeping or managing your Housekeeping? N -  Some recent data might be hidden     Immunizations and Health Maintenance Immunization History  Administered Date(s) Administered  . Influenza,inj,Quad PF,6+ Mos 04/30/2016  . Pneumococcal Conjugate-13 10/18/2017  . Pneumococcal Polysaccharide-23 01/05/2013  . Tdap 03/10/2009  . Zoster 01/05/2013   Health Maintenance Due  Topic Date Due  . INFLUENZA VACCINE  01/23/2018    Patient Care Team: Kathrine Haddock, NP as PCP - General (Nurse Practitioner) Abbie Sons, MD (Urology)  Indicate any recent Medical Services you may have received from other than [facility] providers in the past year (date may be approximate).    Assessment:   This is a routine wellness examination for [place].  Hearing/Vision screen Vision Screening Comments: [facility]   Dietary issues and exercise activities discussed: Current Exercise Habits: Home exercise routine, Type of exercise: walking, Time (Minutes): > 60, Frequency (Times/Week): 7, Weekly Exercise (Minutes/Week): 0, Intensity: Mild, Exercise limited by: None identified  Goals    . Quit smoking / using tobacco      Depression Screen PHQ 2/9 Scores 02/20/2018 10/18/2017 10/30/2016 10/26/2015  PHQ - 2 Score 0 0 0 0  PHQ- 9 Score 0 - 0 -    Fall Risk Fall Risk  02/20/2018 10/18/2017 10/26/2015  Falls in the past year? No No No    Is the patient's home free of loose throw rugs in walkways, pet beds, electrical cords, etc?   yes      Grab bars in the  bathroom? no      Handrails on the stairs?   yes      Adequate lighting?   yes  Timed Get Up and Go performed: Completed in 8 seconds with no use of assistive devices, steady gait. No intervention needed at this time.   Cognitive Function:     6CIT Screen 02/20/2018  What Year? 0 points  What month? 0 points  What time? 0 points  Count back from 20 0 points  Months in reverse  0 points  Repeat phrase 0 points  Total Score 0    Screening Tests Health Maintenance  Topic Date Due  . INFLUENZA VACCINE  01/23/2018  . PNA vac Low Risk Adult (2 of 2 - PPSV23) 10/19/2018  . TETANUS/TDAP  03/11/2019  . COLONOSCOPY  09/12/2024  . Hepatitis C Screening  Completed    Qualifies for Shingles Vaccine? Yes, discussed shingrix vaccine   Cancer Screenings: Lung: Low Dose CT Chest recommended if Age 45-80 years, 30 pack-year currently smoking OR have quit w/in 15years. Patient does qualify.completed 11/11/2017 Colorectal: completed 09/13/2014  Additional Screenings: Hepatitis C Screening: completed 10/26/2015      Plan:    I have personally reviewed and addressed the Medicare Annual Wellness questionnaire and have noted the following in the patient's chart:  A. Medical and social history B. Use of alcohol, tobacco or illicit drugs  C. Current medications and supplements D. Functional ability and status E.  Nutritional status F.  Physical activity G. Advance directives H. List of other physicians I.  Hospitalizations, surgeries, and ER visits in previous 12 months J.  [place] such as hearing and vision if needed, cognitive and depression L. Referrals and appointments   In addition, I have reviewed and discussed with patient certain preventive protocols, quality metrics, and best practice recommendations. A written personalized care plan for preventive services as well as general preventive health recommendations were provided to patient.   Signed,  Tyler Aas,  LPN Nurse Health Advisor   Nurse Notes:none

## 2018-02-20 NOTE — Progress Notes (Signed)
Subjective:    Patient ID: Billy Duncan, male    DOB: 1950-11-09, 67 y.o.   MRN: [id number]  Billy Duncan is a 67 y.o. male presenting on 02/20/2018 for Follow-up (4 week f/up)   HPI   Patient is presenting today for hyponatremia. Last sodium was 128 one month ago. His HCTZ was discontinued and he is only taking amlodipine 5 mg daily and Lisinopril 20 mg daily. He denies any headaches, dizziness, muscle weakness.   BP Readings from Last 3 Encounters:  02/20/18 132/64  02/20/18 132/64  01/17/18 121/74     Social History   Tobacco Use  . Smoking status: Current Every Day Smoker    Packs/day: 1.00    Years: 46.00    Pack years: 46.00    Types: Cigarettes  . Smokeless tobacco: Never Used  Substance Use Topics  . Alcohol use: Yes    Alcohol/week: 14.0 standard drinks    Types: 14 Glasses of wine per week  . Drug use: No    Review of Systems Per HPI unless specifically indicated above     Objective:    BP 132/64   Pulse 72   Temp 98.2 F (36.8 C) (Oral)   Ht 5' 11.5" (1.816 m)   Wt 177 lb 3.2 oz (80.4 kg)   SpO2 98%   BMI 24.37 kg/m   Wt Readings from Last 3 Encounters:  02/20/18 177 lb 3.2 oz (80.4 kg)  02/20/18 177 lb 3.2 oz (80.4 kg)  01/17/18 177 lb 3.2 oz (80.4 kg)    Physical Exam  Constitutional: He is oriented to person, place, and time. He appears well-developed and well-nourished.  Cardiovascular: Normal rate and regular rhythm.  Pulmonary/Chest: Effort normal and breath sounds normal.  Neurological: He is alert and oriented to person, place, and time.  Skin: Skin is warm and dry.  Psychiatric: He has a normal mood and affect. His behavior is normal.   Results for orders placed or performed in visit on 01/17/18  Lipid Panel Piccolo, [employer]  Result Value Ref Range   Cholesterol Piccolo, Waived 173 <200 mg/dL   HDL Chol Piccolo, Waived CANCELED    Triglycerides Piccolo,Waived 90 <150 mg/dL   VLDL Chol Calc Piccolo,Waive 18 <30 mg/dL    Comprehensive metabolic panel  Result Value Ref Range   Glucose 86 65 - 99 mg/dL   BUN 11 8 - 27 mg/dL   Creatinine, Ser 1.20 0.76 - 1.27 mg/dL   GFR calc non Af Amer 63 >59 mL/min/1.73   GFR calc Af Amer 72 >59 mL/min/1.73   BUN/Creatinine Ratio 9 (L) 10 - 24   Sodium 128 (L) 134 - 144 mmol/L   Potassium 4.0 3.5 - 5.2 mmol/L   Chloride 92 (L) 96 - 106 mmol/L   CO2 19 (L) 20 - 29 mmol/L   Calcium 9.9 8.6 - 10.2 mg/dL   Total Protein 6.5 6.0 - 8.5 g/dL   Albumin 4.6 3.6 - 4.8 g/dL   Globulin, Total 1.9 1.5 - 4.5 g/dL   Albumin/Globulin Ratio 2.4 (H) 1.2 - 2.2   Bilirubin Total 1.3 (H) 0.0 - 1.2 mg/dL   Alkaline Phosphatase 65 39 - 117 IU/L   AST 33 0 - 40 IU/L   ALT 29 0 - 44 IU/L  Lipid Panel w/o Chol/HDL Ratio  Result Value Ref Range   Cholesterol, Total 177 100 - 199 mg/dL   Triglycerides 73 0 - 149 mg/dL   HDL 115 >39 mg/dL  VLDL Cholesterol Cal 15 5 - 40 mg/dL   LDL Calculated 47 0 - 99 mg/dL      Assessment & Plan:  1. Essential hypertension  Check CMET. BP controlled today.  - Comp Met (CMET)  2. Hyponatremia  - Comp Met (CMET)  3. Need for influenza vaccination  - Flu vaccine HIGH DOSE PF    Follow up plan: Return in about 6 months (around 08/23/2018) for HTn .  Carles Collet, PA-C [facility] 02/20/2018, 1:03 PM

## 2018-02-21 LAB — COMPREHENSIVE METABOLIC PANEL
ALT: 29 IU/L (ref 0–44)
AST: 39 IU/L (ref 0–40)
Albumin/Globulin Ratio: 2.2 (ref 1.2–2.2)
Albumin: 4.6 g/dL (ref 3.6–4.8)
Alkaline Phosphatase: 70 IU/L (ref 39–117)
BUN/Creatinine Ratio: 9 — ABNORMAL LOW (ref 10–24)
BUN: 10 mg/dL (ref 8–27)
Bilirubin Total: 1 mg/dL (ref 0.0–1.2)
CO2: 21 mmol/L (ref 20–29)
Calcium: 9.8 mg/dL (ref 8.6–10.2)
Chloride: 96 mmol/L (ref 96–106)
Creatinine, Ser: 1.08 mg/dL (ref 0.76–1.27)
GFR calc Af Amer: 82 mL/min/{1.73_m2} (ref >59)
GFR calc non Af Amer: 71 mL/min/{1.73_m2} (ref >59)
Globulin, Total: 2.1 g/dL (ref 1.5–4.5)
Glucose: 66 mg/dL (ref 65–99)
Potassium: 4.9 mmol/L (ref 3.5–5.2)
Sodium: 134 mmol/L (ref 134–144)
Total Protein: 6.7 g/dL (ref 6.0–8.5)

## 2018-03-12 ENCOUNTER — Ambulatory Visit (INDEPENDENT_AMBULATORY_CARE_PROVIDER_SITE_OTHER): Payer: Medicare Other | Admitting: Urology

## 2018-03-12 ENCOUNTER — Encounter: Payer: Self-pay | Admitting: Urology

## 2018-03-12 VITALS — BP 113/63 | HR 91 | Ht 72.0 in | Wt 175.4 lb

## 2018-03-12 DIAGNOSIS — Z8546 Personal history of malignant neoplasm of prostate: Secondary | ICD-10-CM | POA: Diagnosis not present

## 2018-03-12 LAB — BLADDER SCAN AMB NON-IMAGING

## 2018-03-12 NOTE — Progress Notes (Signed)
03/12/2018 9:49 AM   Billy Duncan 08/23/1950 [id number]  Referring provider: Kathrine Haddock, NP [address], [place] [address]  Chief Complaint  Patient presents with  . Establish Care   Urologic history: 1.  Prostate cancer - RALP 03/2012 at [facility]; focal positive margin left mid; undetectable PSA postop  HPI: 67 year old male presents today to establish local urologic care.  He has been followed by Dr. Jacqlyn Larsen at [facility] for the past several years and last saw him in August 2018.  His PSA has remained undetectable.  He presently has no voiding complaints.  Denies urinary incontinence or bothersome lower urinary tract symptoms.  Denies dysuria or gross hematuria.   PMH: Past Medical History:  Diagnosis Date  . Hypertension   . Malignant neoplasm prostate ([place])   . Polycythemia   . Tobacco use     Surgical History: Past Surgical History:  Procedure Laterality Date  . FOOT NEUROMA SURGERY Left   . PROSTATECTOMY  03/2012    Home Medications:  Allergies as of 03/12/2018   No Known Allergies     Medication List        Accurate as of 03/12/18  9:49 AM. Always use your most recent med list.          amLODipine 5 MG tablet Commonly known as:  NORVASC Take 1 tablet (5 mg total) by mouth daily.   aspirin EC 81 MG tablet Take 81 mg by mouth daily.   atorvastatin 20 MG tablet Commonly known as:  LIPITOR Take 1 tablet (20 mg total) by mouth daily.   lisinopril 20 MG tablet Commonly known as:  PRINIVIL,ZESTRIL Take 1 tablet (20 mg total) by mouth daily.   omeprazole 20 MG capsule Commonly known as:  PRILOSEC Take 1 capsule (20 mg total) by mouth daily.   sildenafil 20 MG tablet Commonly known as:  REVATIO Take 20 mg by mouth daily as needed.   UNABLE TO FIND Folate 1333 MCG DFE 800 MCG       Allergies: No Known Allergies  Family History: Family History  Problem Relation Age of Onset  . Heart disease Mother        a-fib  . Dementia Mother   .  Stomach cancer Father   . Heart disease Brother   . Heart disease Brother     Social History:  reports that he has been smoking cigarettes. He has a 46.00 pack-year smoking history. He has never used smokeless tobacco. He reports that he drinks about 14.0 standard drinks of alcohol per week. He reports that he does not use drugs.  ROS: UROLOGY Frequent Urination?: No Hard to postpone urination?: No Burning/pain with urination?: No Get up at night to urinate?: No Leakage of urine?: No Urine stream starts and stops?: No Trouble starting stream?: No Do you have to strain to urinate?: No Blood in urine?: No Urinary tract infection?: No Sexually transmitted disease?: No Injury to kidneys or bladder?: No Painful intercourse?: No Weak stream?: No Erection problems?: Yes Penile pain?: No  Gastrointestinal Nausea?: No Vomiting?: No Indigestion/heartburn?: No Diarrhea?: No Constipation?: No  Constitutional Fever: No Night sweats?: No Weight loss?: No Fatigue?: No  Skin Skin rash/lesions?: No Itching?: No  Eyes Blurred vision?: No Double vision?: No  Ears/Nose/Throat Sore throat?: No Sinus problems?: No  Hematologic/Lymphatic Swollen glands?: No Easy bruising?: No  Cardiovascular Leg swelling?: No Chest pain?: No  Respiratory Cough?: No Shortness of breath?: No  Endocrine Excessive thirst?: No  Musculoskeletal  Back pain?: No Joint pain?: No  Neurological Headaches?: No Dizziness?: No  Psychologic Depression?: No Anxiety?: No  Physical Exam: BP 113/63 (BP Location: Left Arm, Patient Position: Sitting, Cuff Size: Normal)   Pulse 91   Ht 6' (1.829 m)   Wt 175 lb 6.4 oz (79.6 kg)   BMI 23.79 kg/m   Constitutional:  Alert and oriented, No acute distress. HEENT: [name] AT, moist mucus membranes.  Trachea midline, no masses. Cardiovascular: No clubbing, cyanosis, or edema. Respiratory: Normal respiratory effort, no increased work of breathing. Skin:  No rashes, bruises or suspicious lesions. Neurologic: Grossly intact, no focal deficits, moving all 4 extremities. Psychiatric: Normal mood and affect.   Assessment & Plan:   67 year old male with a history of prostate cancer and undetectable PSA 5 years out from surgery.  PSA was ordered today and if stable he will continue annual follow-up.    Abbie Sons, [place] [address] [place], [place] [address] [phone]

## 2018-03-13 ENCOUNTER — Telehealth: Payer: Self-pay

## 2018-03-13 LAB — PSA: Prostate Specific Ag, Serum: <0.1 ng/mL (ref 0.0–4.0)

## 2018-03-13 NOTE — Telephone Encounter (Signed)
Patient notified

## 2018-03-13 NOTE — Telephone Encounter (Signed)
-----   Message from Abbie Sons, MD sent at 03/13/2018  7:51 AM EDT ----- PSA remains undetectable at <0.1

## 2018-08-24 ENCOUNTER — Encounter: Payer: Self-pay | Admitting: Nurse Practitioner

## 2018-08-25 ENCOUNTER — Other Ambulatory Visit: Payer: Self-pay

## 2018-08-25 ENCOUNTER — Ambulatory Visit (INDEPENDENT_AMBULATORY_CARE_PROVIDER_SITE_OTHER): Payer: Medicare Other | Admitting: Nurse Practitioner

## 2018-08-25 ENCOUNTER — Encounter: Payer: Self-pay | Admitting: Nurse Practitioner

## 2018-08-25 VITALS — BP 138/77 | HR 87 | Temp 98.5°F | Ht 72.0 in | Wt 180.0 lb

## 2018-08-25 DIAGNOSIS — K219 Gastro-esophageal reflux disease without esophagitis: Secondary | ICD-10-CM | POA: Diagnosis not present

## 2018-08-25 DIAGNOSIS — N183 Chronic kidney disease, stage 3 unspecified: Secondary | ICD-10-CM

## 2018-08-25 DIAGNOSIS — F17210 Nicotine dependence, cigarettes, uncomplicated: Secondary | ICD-10-CM | POA: Diagnosis not present

## 2018-08-25 DIAGNOSIS — I10 Essential (primary) hypertension: Secondary | ICD-10-CM

## 2018-08-25 DIAGNOSIS — E782 Mixed hyperlipidemia: Secondary | ICD-10-CM

## 2018-08-25 MED ORDER — AMLODIPINE BESYLATE 5 MG PO TABS: 5.0000 mg | ORAL_TABLET | Freq: Every day | ORAL | 3 refills | Status: DC

## 2018-08-25 MED ORDER — OMEPRAZOLE 20 MG PO CPDR: 20.0000 mg | DELAYED_RELEASE_CAPSULE | Freq: Every day | ORAL | 2 refills | Status: DC

## 2018-08-25 NOTE — Patient Instructions (Signed)
DASH Eating Plan  DASH stands for "Dietary Approaches to Stop Hypertension." The DASH eating plan is a healthy eating plan that has been shown to reduce high blood pressure (hypertension). It may also reduce your risk for type 2 diabetes, heart disease, and stroke. The DASH eating plan may also help with weight loss.  What are tips for following this plan?    General guidelines   Avoid eating more than 2,300 mg (milligrams) of salt (sodium) a day. If you have hypertension, you may need to reduce your sodium intake to 1,500 mg a day.   Limit alcohol intake to no more than 1 drink a day for nonpregnant women and 2 drinks a day for men. One drink equals 12 oz of beer, 5 oz of wine, or 1 oz of hard liquor.   Work with your health care provider to maintain a healthy body weight or to lose weight. Ask what an ideal weight is for you.   Get at least 30 minutes of exercise that causes your heart to beat faster (aerobic exercise) most days of the week. Activities may include walking, swimming, or biking.   Work with your health care provider or diet and nutrition specialist (dietitian) to adjust your eating plan to your individual calorie needs.  Reading food labels     Check food labels for the amount of sodium per serving. Choose foods with less than 5 percent of the Daily Value of sodium. Generally, foods with less than 300 mg of sodium per serving fit into this eating plan.   To find whole grains, look for the word "whole" as the first word in the ingredient list.  Shopping   Buy products labeled as "low-sodium" or "no salt added."   Buy fresh foods. Avoid canned foods and premade or frozen meals.  Cooking   Avoid adding salt when cooking. Use salt-free seasonings or herbs instead of table salt or sea salt. Check with your health care provider or pharmacist before using salt substitutes.   Do not fry foods. Cook foods using healthy methods such as baking, boiling, grilling, and broiling instead.   Cook with  heart-healthy oils, such as olive, canola, soybean, or sunflower oil.  Meal planning   Eat a balanced diet that includes:  ? 5 or more servings of fruits and vegetables each day. At each meal, try to fill half of your plate with fruits and vegetables.  ? Up to 6-8 servings of whole grains each day.  ? Less than 6 oz of lean meat, poultry, or fish each day. A 3-oz serving of meat is about the same size as a deck of cards. One egg equals 1 oz.  ? 2 servings of low-fat dairy each day.  ? A serving of nuts, seeds, or beans 5 times each week.  ? Heart-healthy fats. Healthy fats called Omega-3 fatty acids are found in foods such as flaxseeds and coldwater fish, like sardines, salmon, and mackerel.   Limit how much you eat of the following:  ? Canned or prepackaged foods.  ? Food that is high in trans fat, such as fried foods.  ? Food that is high in saturated fat, such as fatty meat.  ? Sweets, desserts, sugary drinks, and other foods with added sugar.  ? Full-fat dairy products.   Do not salt foods before eating.   Try to eat at least 2 vegetarian meals each week.   Eat more home-cooked food and less restaurant, buffet, and fast food.     When eating at a restaurant, ask that your food be prepared with less salt or no salt, if possible.  What foods are recommended?  The items listed may not be a complete list. Talk with your dietitian about what dietary choices are best for you.  Grains  Whole-grain or whole-wheat bread. Whole-grain or whole-wheat pasta. Brown rice. Oatmeal. Quinoa. Bulgur. Whole-grain and low-sodium cereals. Pita bread. Low-fat, low-sodium crackers. Whole-wheat flour tortillas.  Vegetables  Fresh or frozen vegetables (raw, steamed, roasted, or grilled). Low-sodium or reduced-sodium tomato and vegetable juice. Low-sodium or reduced-sodium tomato sauce and tomato paste. Low-sodium or reduced-sodium canned vegetables.  Fruits  All fresh, dried, or frozen fruit. Canned fruit in natural juice (without  added sugar).  Meat and other protein foods  Skinless chicken or turkey. Ground chicken or turkey. Pork with fat trimmed off. Fish and seafood. Egg whites. Dried beans, peas, or lentils. Unsalted nuts, nut butters, and seeds. Unsalted canned beans. Lean cuts of beef with fat trimmed off. Low-sodium, lean deli meat.  Dairy  Low-fat (1%) or fat-free (skim) milk. Fat-free, low-fat, or reduced-fat cheeses. Nonfat, low-sodium ricotta or cottage cheese. Low-fat or nonfat yogurt. Low-fat, low-sodium cheese.  Fats and oils  Soft margarine without trans fats. Vegetable oil. Low-fat, reduced-fat, or light mayonnaise and salad dressings (reduced-sodium). Canola, safflower, olive, soybean, and sunflower oils. Avocado.  Seasoning and other foods  Herbs. Spices. Seasoning mixes without salt. Unsalted popcorn and pretzels. Fat-free sweets.  What foods are not recommended?  The items listed may not be a complete list. Talk with your dietitian about what dietary choices are best for you.  Grains  Baked goods made with fat, such as croissants, muffins, or some breads. Dry pasta or rice meal packs.  Vegetables  Creamed or fried vegetables. Vegetables in a cheese sauce. Regular canned vegetables (not low-sodium or reduced-sodium). Regular canned tomato sauce and paste (not low-sodium or reduced-sodium). Regular tomato and vegetable juice (not low-sodium or reduced-sodium). Pickles. Olives.  Fruits  Canned fruit in a light or heavy syrup. Fried fruit. Fruit in cream or butter sauce.  Meat and other protein foods  Fatty cuts of meat. Ribs. Fried meat. Bacon. Sausage. Bologna and other processed lunch meats. Salami. Fatback. Hotdogs. Bratwurst. Salted nuts and seeds. Canned beans with added salt. Canned or smoked fish. Whole eggs or egg yolks. Chicken or turkey with skin.  Dairy  Whole or 2% milk, cream, and half-and-half. Whole or full-fat cream cheese. Whole-fat or sweetened yogurt. Full-fat cheese. Nondairy creamers. Whipped toppings.  Processed cheese and cheese spreads.  Fats and oils  Butter. Stick margarine. Lard. Shortening. Ghee. Bacon fat. Tropical oils, such as coconut, palm kernel, or palm oil.  Seasoning and other foods  Salted popcorn and pretzels. Onion salt, garlic salt, seasoned salt, table salt, and sea salt. Worcestershire sauce. Tartar sauce. Barbecue sauce. Teriyaki sauce. Soy sauce, including reduced-sodium. Steak sauce. Canned and packaged gravies. Fish sauce. Oyster sauce. Cocktail sauce. Horseradish that you find on the shelf. Ketchup. Mustard. Meat flavorings and tenderizers. Bouillon cubes. Hot sauce and Tabasco sauce. Premade or packaged marinades. Premade or packaged taco seasonings. Relishes. Regular salad dressings.  Where to find more information:   National Heart, Lung, and Blood Institute: www.nhlbi.nih.gov   American Heart Association: www.heart.org  Summary   The DASH eating plan is a healthy eating plan that has been shown to reduce high blood pressure (hypertension). It may also reduce your risk for type 2 diabetes, heart disease, and stroke.   With the   DASH eating plan, you should limit salt (sodium) intake to 2,300 mg a day. If you have hypertension, you may need to reduce your sodium intake to 1,500 mg a day.   When on the DASH eating plan, aim to eat more fresh fruits and vegetables, whole grains, lean proteins, low-fat dairy, and heart-healthy fats.   Work with your health care provider or diet and nutrition specialist (dietitian) to adjust your eating plan to your individual calorie needs.  This information is not intended to replace advice given to you by your health care provider. Make sure you discuss any questions you have with your health care provider.  Document Released: 05/31/2011 Document Revised: 06/04/2016 Document Reviewed: 06/04/2016  Elsevier Interactive Patient Education  2019 Elsevier Inc.

## 2018-08-25 NOTE — Assessment & Plan Note (Signed)
Chronic, ongoing.  Continue current medication regimen.  Lipid panel today. 

## 2018-08-25 NOTE — Assessment & Plan Note (Signed)
Chronic, ongoing.  Continue current medication regimen.  Mag level today. 

## 2018-08-25 NOTE — Assessment & Plan Note (Signed)
On Lisinopril for kidney protection. CMP today.

## 2018-08-25 NOTE — Assessment & Plan Note (Signed)
Chronic, ongoing.  BP at goal today.  Continue current medication regimen and adjust as needed.  CMP today.

## 2018-08-25 NOTE — Assessment & Plan Note (Signed)
I have recommended complete cessation of tobacco use. I have discussed various options available for assistance with tobacco cessation including over the counter methods (Nicotine gum, patch and lozenges). We also discussed prescription options (Chantix, Nicotine Inhaler / Nasal Spray). The patient is not interested in pursuing any prescription tobacco cessation options at this time.  

## 2018-08-25 NOTE — Progress Notes (Signed)
BP 138/77   Pulse 87   Temp 98.5 F (36.9 C) (Oral)   Ht 6' (1.829 m)   Wt 180 lb (81.6 kg)   SpO2 97%   BMI 24.41 kg/m    Subjective:    Patient ID: Billy Duncan, male    DOB: December 04, 1950, 68 y.o.   MRN: [id number]  HPI: Billy Duncan is a 68 y.o. male  Chief Complaint  Patient presents with  . Hypertension    f/u   HYPERTENSION / HYPERLIPIDEMIA Currently takes Lisinopril and Amlodipine for HTN.  Lipitor for HLD. Satisfied with current treatment? yes Duration of hypertension: chronic BP monitoring frequency: not checking BP range:  BP medication side effects: no Duration of hyperlipidemia: chronic Cholesterol medication side effects: no Cholesterol supplements: none Medication compliance: good compliance Aspirin: yes Recent stressors: no Recurrent headaches: no Visual changes: no Palpitations: no Dyspnea: no Chest pain: no Lower extremity edema: no Dizzy/lightheaded: no  GERD Currently takes Omeprazole daily. GERD control status: controlled  Satisfied with current treatment? yes Heartburn frequency: none Medication side effects: no  Medication compliance: good Previous GERD medications: none Antacid use frequency:  none Duration: years Nature:  Location:  Heartburn duration:  Alleviatiating factors:   Aggravating factors:  Dysphagia: no Odynophagia:  no Hematemesis: no Blood in stool: no EGD: no  NICOTINE USE: Currently smokes less than a pack a day.  Has been smoking since age 22.  Not interested in quitting at this time.  Has been going for CT scans, next due in May.    CHRONIC KIDNEY DISEASE Currently on Lisinopril which offers kidney protection. CKD status: stable Medications renally dose: yes Previous renal evaluation: no Pneumovax:  Up to Date Influenza Vaccine:  Up to Date  Relevant past medical, surgical, family and social history reviewed and updated as indicated. Interim medical history since our last visit  reviewed. Allergies and medications reviewed and updated.  Review of Systems  Constitutional: Negative for activity change, diaphoresis, fatigue and fever.  Respiratory: Negative for cough, chest tightness, shortness of breath and wheezing.   Cardiovascular: Negative for chest pain, palpitations and leg swelling.  Gastrointestinal: Negative for abdominal distention, abdominal pain, constipation, diarrhea, nausea and vomiting.  Endocrine: Negative for cold intolerance, heat intolerance, polydipsia, polyphagia and polyuria.  Musculoskeletal: Negative.   Skin: Negative.   Neurological: Negative for dizziness, syncope, weakness, light-headedness, numbness and headaches.  Psychiatric/Behavioral: Negative.     Per HPI unless specifically indicated above     Objective:    BP 138/77   Pulse 87   Temp 98.5 F (36.9 C) (Oral)   Ht 6' (1.829 m)   Wt 180 lb (81.6 kg)   SpO2 97%   BMI 24.41 kg/m   Wt Readings from Last 3 Encounters:  08/25/18 180 lb (81.6 kg)  03/12/18 175 lb 6.4 oz (79.6 kg)  02/20/18 177 lb 3.2 oz (80.4 kg)    Physical Exam Vitals signs and nursing note reviewed.  Constitutional:      Appearance: He is well-developed.  HENT:     Head: Normocephalic and atraumatic.     Right Ear: Hearing normal. No drainage.     Left Ear: Hearing normal. No drainage.     Mouth/Throat:     Pharynx: Uvula midline.  Eyes:     General: Lids are normal.        Right eye: No discharge.        Left eye: No discharge.     Conjunctiva/sclera: Conjunctivae normal.  Pupils: Pupils are equal, round, and reactive to light.  Neck:     Musculoskeletal: Normal range of motion and neck supple.     Thyroid: No thyromegaly.     Vascular: No carotid bruit or JVD.     Trachea: Trachea normal.  Cardiovascular:     Rate and Rhythm: Normal rate and regular rhythm.     Heart sounds: Normal heart sounds, S1 normal and S2 normal. No murmur. No gallop.   Pulmonary:     Effort: Pulmonary effort  is normal.     Breath sounds: Normal breath sounds.  Abdominal:     General: Bowel sounds are normal.     Palpations: Abdomen is soft. There is no hepatomegaly or splenomegaly.  Musculoskeletal: Normal range of motion.     Right lower leg: No edema.     Left lower leg: No edema.  Skin:    General: Skin is warm and dry.     Capillary Refill: Capillary refill takes less than 2 seconds.     Findings: No rash.  Neurological:     Mental Status: He is alert and oriented to person, place, and time.     Deep Tendon Reflexes: Reflexes are normal and symmetric.  Psychiatric:        Mood and Affect: Mood normal.        Behavior: Behavior normal.        Thought Content: Thought content normal.        Judgment: Judgment normal.     Results for orders placed or performed in visit on 03/12/18  PSA  Result Value Ref Range   Prostate Specific Ag, Serum <0.1 0.0 - 4.0 ng/mL  Bladder Scan (Post Void Residual) in office  Result Value Ref Range   Scan Result 60mL       Assessment & Plan:   Problem List Items Addressed This Visit      Cardiovascular and Mediastinum   Hypertension - Primary    Chronic, ongoing.  BP at goal today.  Continue current medication regimen and adjust as needed.  CMP today.      Relevant Medications   amLODipine (NORVASC) 5 MG tablet   Other Relevant Orders   Comprehensive metabolic panel     Digestive   GERD (gastroesophageal reflux disease)    Chronic, ongoing.  Continue current medication regimen.  Mag level today.      Relevant Medications   omeprazole (PRILOSEC) 20 MG capsule   Other Relevant Orders   Magnesium     Genitourinary   Stage 3 chronic kidney disease ([place])    On Lisinopril for kidney protection. CMP today.      Relevant Orders   Comprehensive metabolic panel     Other   Nicotine dependence, cigarettes, uncomplicated    I have recommended complete cessation of tobacco use. I have discussed various options available for assistance  with tobacco cessation including over the counter methods (Nicotine gum, patch and lozenges). We also discussed prescription options (Chantix, Nicotine Inhaler / Nasal Spray). The patient is not interested in pursuing any prescription tobacco cessation options at this time.      Mixed hyperlipidemia    Chronic, ongoing.  Continue current medication regimen.  Lipid panel today.      Relevant Medications   amLODipine (NORVASC) 5 MG tablet   Other Relevant Orders   Lipid Panel w/o Chol/HDL Ratio       Follow up plan: Return in about 6 months (around 02/25/2019) for HTN/HLD,  CKD.

## 2018-08-26 LAB — COMPREHENSIVE METABOLIC PANEL
ALK PHOS: 72 IU/L (ref 39–117)
ALT: 30 IU/L (ref 0–44)
AST: 32 IU/L (ref 0–40)
Albumin/Globulin Ratio: 2.5 — ABNORMAL HIGH (ref 1.2–2.2)
Albumin: 4.8 g/dL (ref 3.8–4.8)
BUN/Creatinine Ratio: 8 — ABNORMAL LOW (ref 10–24)
BUN: 10 mg/dL (ref 8–27)
Bilirubin Total: 1.3 mg/dL — ABNORMAL HIGH (ref 0.0–1.2)
CO2: 20 mmol/L (ref 20–29)
CREATININE: 1.2 mg/dL (ref 0.76–1.27)
Calcium: 10.1 mg/dL (ref 8.6–10.2)
Chloride: 100 mmol/L (ref 96–106)
GFR calc Af Amer: 72 mL/min/{1.73_m2} (ref >59)
GFR calc non Af Amer: 63 mL/min/{1.73_m2} (ref >59)
GLUCOSE: 94 mg/dL (ref 65–99)
Globulin, Total: 1.9 g/dL (ref 1.5–4.5)
Potassium: 4.7 mmol/L (ref 3.5–5.2)
Sodium: 135 mmol/L (ref 134–144)
Total Protein: 6.7 g/dL (ref 6.0–8.5)

## 2018-08-26 LAB — LIPID PANEL W/O CHOL/HDL RATIO
CHOLESTEROL TOTAL: 195 mg/dL (ref 100–199)
HDL: 143 mg/dL (ref >39)
LDL CALC: 40 mg/dL (ref 0–99)
TRIGLYCERIDES: 60 mg/dL (ref 0–149)
VLDL Cholesterol Cal: 12 mg/dL (ref 5–40)

## 2018-08-26 LAB — MAGNESIUM: Magnesium: 1.6 mg/dL (ref 1.6–2.3)

## 2018-09-29 ENCOUNTER — Encounter: Payer: Self-pay | Admitting: *Deleted

## 2018-10-06 ENCOUNTER — Other Ambulatory Visit: Payer: Self-pay | Admitting: Unknown Physician Specialty

## 2018-10-08 ENCOUNTER — Other Ambulatory Visit: Payer: Self-pay | Admitting: Unknown Physician Specialty

## 2018-10-08 NOTE — Telephone Encounter (Signed)
Requested medication (s) are due for refill today: Yes  Requested medication (s) are on the active medication list: Yes  Last refill:  01/20/18  Future visit scheduled: Yes  Notes to clinic:  See request    Requested Prescriptions  Pending Prescriptions Disp Refills   lisinopril (PRINIVIL,ZESTRIL) 10 MG tablet [Pharmacy Med Name: LISINOPRIL 10 MG TABLET]  0    Sig: Please specify directions, refills and quantity     Cardiovascular:  ACE Inhibitors Passed - 10/08/2018 12:34 PM      Passed - Cr in normal range and within 180 days    Creatinine, Ser  Date Value Ref Range Status  08/25/2018 1.20 0.76 - 1.27 mg/dL Final         Passed - K in normal range and within 180 days    Potassium  Date Value Ref Range Status  08/25/2018 4.7 3.5 - 5.2 mmol/L Final         Passed - Patient is not pregnant      Passed - Last BP in normal range    BP Readings from Last 1 Encounters:  08/25/18 138/77         Passed - Valid encounter within last 6 months    Recent Outpatient Visits          1 month ago Essential hypertension   [place], Henrine Screws T, NP   7 months ago Essential hypertension   [place], [place], PA-C   8 months ago Essential hypertension   [facility] Kathrine Haddock, NP   11 months ago Need for pneumococcal vaccination   [facility] Kathrine Haddock, NP   1 year ago Essential hypertension   [facility] Kathrine Haddock, NP      Future Appointments            In 4 months Cannady, Barbaraann Faster, NP [facility], [place]   In 4 months  [facility], [place]   In 5 months Stoioff, Ronda Fairly, MD [facility]

## 2018-10-09 ENCOUNTER — Other Ambulatory Visit: Payer: Self-pay | Admitting: Nurse Practitioner

## 2018-10-09 ENCOUNTER — Telehealth: Payer: Self-pay

## 2018-10-09 MED ORDER — LISINOPRIL 20 MG PO TABS: 20.0000 mg | ORAL_TABLET | Freq: Every day | ORAL | 3 refills | Status: DC

## 2018-10-09 NOTE — Telephone Encounter (Signed)
Fixed it.  Was taking 20 MG once a day and sent rx for this.

## 2018-10-09 NOTE — Telephone Encounter (Signed)
Rx for Lisinopril is missing directions, refills and quantity for prescription.  Please send New Rx with missing information.

## 2018-10-09 NOTE — Progress Notes (Signed)
Lisinopril refill sent per previous dose 20 MG once a day.

## 2018-10-28 ENCOUNTER — Encounter: Payer: Self-pay | Admitting: *Deleted

## 2018-12-09 ENCOUNTER — Telehealth: Payer: Self-pay | Admitting: *Deleted

## 2018-12-09 DIAGNOSIS — Z122 Encounter for screening for malignant neoplasm of respiratory organs: Secondary | ICD-10-CM

## 2018-12-09 DIAGNOSIS — Z87891 Personal history of nicotine dependence: Secondary | ICD-10-CM

## 2018-12-09 NOTE — Telephone Encounter (Signed)
Patient has been notified that annual lung cancer screening low dose CT scan is due currently or will be in near future. Confirmed that patient is within the age range of 55-77, and asymptomatic, (no signs or symptoms of lung cancer). Patient denies illness that would prevent curative treatment for lung cancer if found. Verified smoking history, (current, 48 pack year). The shared decision making visit was done 11/06/16. Patient is agreeable for CT scan being scheduled.

## 2018-12-12 ENCOUNTER — Ambulatory Visit
Admission: RE | Admit: 2018-12-12 | Discharge: 2018-12-12 | Disposition: A | Discharge status: Home / Self Care | Payer: Medicare Other | Source: Ambulatory Visit | Attending: Oncology | Admitting: Oncology

## 2018-12-12 ENCOUNTER — Other Ambulatory Visit: Payer: Self-pay

## 2018-12-12 DIAGNOSIS — Z136 Encounter for screening for cardiovascular disorders: Secondary | ICD-10-CM | POA: Diagnosis not present

## 2018-12-12 DIAGNOSIS — Z122 Encounter for screening for malignant neoplasm of respiratory organs: Secondary | ICD-10-CM | POA: Diagnosis not present

## 2018-12-12 DIAGNOSIS — Z87891 Personal history of nicotine dependence: Secondary | ICD-10-CM | POA: Diagnosis not present

## 2018-12-14 ENCOUNTER — Encounter: Payer: Self-pay | Admitting: Family Medicine

## 2018-12-14 DIAGNOSIS — I700 Atherosclerosis of aorta: Secondary | ICD-10-CM | POA: Insufficient documentation

## 2018-12-14 DIAGNOSIS — J439 Emphysema, unspecified: Secondary | ICD-10-CM | POA: Insufficient documentation

## 2018-12-15 ENCOUNTER — Encounter: Payer: Self-pay | Admitting: *Deleted

## 2019-02-25 ENCOUNTER — Ambulatory Visit: Payer: 59 | Admitting: Nurse Practitioner

## 2019-02-25 ENCOUNTER — Ambulatory Visit: Payer: 59

## 2019-03-16 ENCOUNTER — Ambulatory Visit (INDEPENDENT_AMBULATORY_CARE_PROVIDER_SITE_OTHER): Payer: Medicare Other | Admitting: Urology

## 2019-03-16 ENCOUNTER — Encounter: Payer: Self-pay | Admitting: Urology

## 2019-03-16 ENCOUNTER — Other Ambulatory Visit: Payer: Self-pay

## 2019-03-16 VITALS — BP 125/79 | HR 98 | Ht 72.0 in | Wt 178.6 lb

## 2019-03-16 DIAGNOSIS — Z8546 Personal history of malignant neoplasm of prostate: Secondary | ICD-10-CM | POA: Diagnosis not present

## 2019-03-16 NOTE — Progress Notes (Signed)
03/16/2019 10:45 AM   Billy Duncan Jan 16, 1951 [id number]  Referring provider: Kathrine Haddock, NP [address],  [place] [address]  Chief Complaint  Patient presents with  . Prostate Cancer    Urologic history: 1.  Prostate cancer - RALP 03/2012 at [facility]; focal positive margin left mid; undetectable PSA postop   HPI: Mr. Fish presents for annual follow-up.  He states he has been doing well.  Denies bothersome lower urinary tract symptoms.  He has no flank, abdominal or pelvic pain.  Denies dysuria or gross hematuria.  PMH: Past Medical History:  Diagnosis Date  . Hypertension   . Malignant neoplasm prostate ([place])   . Polycythemia   . Tobacco use     Surgical History: Past Surgical History:  Procedure Laterality Date  . FOOT NEUROMA SURGERY Left   . PROSTATECTOMY  03/2012    Home Medications:  Allergies as of 03/16/2019   No Known Allergies     Medication List       Accurate as of March 16, 2019 10:45 AM. If you have any questions, ask your nurse or doctor.        amLODipine 5 MG tablet Commonly known as: NORVASC Take 1 tablet (5 mg total) by mouth daily.   aspirin EC 81 MG tablet Take 81 mg by mouth daily.   atorvastatin 20 MG tablet Commonly known as: LIPITOR TAKE 1 TABLET BY MOUTH EVERY DAY   lisinopril 10 MG tablet Commonly known as: ZESTRIL Take 20 mg by mouth daily. What changed: Another medication with the same name was removed. Continue taking this medication, and follow the directions you see here. Changed by: Billy Sons, MD   omeprazole 20 MG capsule Commonly known as: PRILOSEC Take 1 capsule (20 mg total) by mouth daily.   UNABLE TO FIND Folate 1333 MCG DFE 800 MCG       Allergies: No Known Allergies  Family History: Family History  Problem Relation Age of Onset  . Heart disease Mother        a-fib  . Dementia Mother   . Stomach cancer Father   . Heart disease Brother   . Heart disease Brother      Social History:  reports that he has been smoking cigarettes. He has a 46.00 pack-year smoking history. He has never used smokeless tobacco. He reports current alcohol use of about 14.0 standard drinks of alcohol per week. He reports that he does not use drugs.  ROS: UROLOGY Frequent Urination?: No Hard to postpone urination?: No Burning/pain with urination?: No Get up at night to urinate?: No Leakage of urine?: No Urine stream starts and stops?: No Trouble starting stream?: No Do you have to strain to urinate?: No Blood in urine?: No Urinary tract infection?: No Sexually transmitted disease?: No Injury to kidneys or bladder?: No Painful intercourse?: No Weak stream?: No Erection problems?: No Penile pain?: No  Gastrointestinal Nausea?: No Vomiting?: No Indigestion/heartburn?: No Diarrhea?: No Constipation?: No  Constitutional Fever: No Night sweats?: No Weight loss?: No Fatigue?: No  Skin Skin rash/lesions?: No Itching?: No  Eyes Blurred vision?: No Double vision?: No  Ears/Nose/Throat Sore throat?: No Sinus problems?: No  Hematologic/Lymphatic Swollen glands?: No Easy bruising?: No  Cardiovascular Leg swelling?: No Chest pain?: No  Respiratory Cough?: No Shortness of breath?: No  Endocrine Excessive thirst?: No  Musculoskeletal Back pain?: No Joint pain?: No  Neurological Headaches?: No Dizziness?: No  Psychologic Depression?: No Anxiety?: No  Physical Exam:  BP 125/79 (BP Location: Left Arm, Patient Position: Sitting, Cuff Size: Normal)   Pulse 98   Ht 6' (1.829 m)   Wt 178 lb 9.6 oz (81 kg)   BMI 24.22 kg/m   Constitutional:  Alert and oriented, No acute distress. HEENT: [place] AT, moist mucus membranes.  Trachea midline, no masses. Cardiovascular: No clubbing, cyanosis, or edema. Respiratory: Normal respiratory effort, no increased work of breathing. GI: Abdomen is soft, nontender, nondistended, no abdominal masses GU: No CVA  tenderness Skin: No rashes, bruises or suspicious lesions. Neurologic: Grossly intact, no focal deficits, moving all 4 extremities. Psychiatric: Normal mood and affect.   Assessment & Plan:    - Personal history prostate cancer PSA drawn today and if it remains undetectable he will continue annual follow-up.   Billy Duncan, [place] [address] [place], [place] [address] [phone]

## 2019-03-17 LAB — PSA: Prostate Specific Ag, Serum: <0.1 ng/mL (ref 0.0–4.0)

## 2019-03-18 ENCOUNTER — Telehealth: Payer: Self-pay | Admitting: Family Medicine

## 2019-03-18 NOTE — Telephone Encounter (Signed)
Patient notified and voiced understanding.

## 2019-03-18 NOTE — Telephone Encounter (Signed)
-----   Message from Abbie Sons, MD sent at 03/17/2019  5:58 PM EDT ----- PSA remains undetectable at <0.1

## 2019-03-22 ENCOUNTER — Encounter: Payer: Self-pay | Admitting: Urology

## 2019-04-03 ENCOUNTER — Other Ambulatory Visit: Payer: Self-pay | Admitting: Unknown Physician Specialty

## 2019-05-13 ENCOUNTER — Ambulatory Visit (INDEPENDENT_AMBULATORY_CARE_PROVIDER_SITE_OTHER): Payer: Medicare Other

## 2019-05-13 VITALS — BP 124/67 | HR 72 | Ht 72.0 in | Wt 178.0 lb

## 2019-05-13 DIAGNOSIS — Z0000 Encounter for general adult medical examination without abnormal findings: Secondary | ICD-10-CM

## 2019-05-13 NOTE — Progress Notes (Signed)
Subjective:   Billy Billy is a 68 y.o. male who presents for Medicare Annual/Subsequent preventive examination.  This visit is being conducted via phone call  - after an attempt to do on video chat - due to the COVID-19 pandemic. This patient has given me verbal consent via phone to conduct this visit, patient states they are participating from their home address. Some vital signs may be absent or patient reported.   Patient identification: identified by name, DOB, and current address.    Review of Systems:   Cardiac Risk Factors include: advanced age (>20men, >39 women);dyslipidemia;hypertension;male gender;smoking/ tobacco exposure     Objective:    Vitals: BP 124/67   Pulse 72   Ht 6' (1.829 m)   Wt 178 lb (80.7 kg)   BMI 24.14 kg/m   Body mass index is 24.14 kg/m.  Advanced Directives 05/13/2019 02/20/2018  Does Patient Have a Medical Advance Directive? No No  Would patient like information on creating a medical advance directive? - No - Patient declined    Tobacco Social History   Tobacco Use  Smoking Status Current Every Day Smoker  . Packs/day: 1.00  . Years: 46.00  . Pack years: 46.00  . Types: Cigarettes  Smokeless Tobacco Never Used     Ready to quit: Yes Counseling given: No   Clinical Intake:  Pre-visit preparation completed: Yes  Pain : No/denies pain     Nutritional Status: BMI of 19-24  Normal Diabetes: No  How often do you need to have someone help you when you read instructions, pamphlets, or other written materials from your doctor or pharmacy?: 1 - Never  Interpreter Needed?: No  Information entered by :: Aizlyn Schifano,LPN  Past Medical History:  Diagnosis Date  . Hypertension   . Malignant neoplasm prostate ([place])   . Polycythemia   . Tobacco use    Past Surgical History:  Procedure Laterality Date  . FOOT NEUROMA SURGERY Left   . PROSTATECTOMY  03/2012   Family History  Problem Relation Age of Onset  . Heart disease  Mother        a-fib  . Dementia Mother   . Stomach cancer Father   . Heart disease Brother   . Heart disease Brother    Social History   Socioeconomic History  . Marital status: Divorced    Spouse name: Not on file  . Number of children: 2  . Years of education: Not on file  . Highest education level: Some college, no degree  Occupational History  . Not on file  Social Needs  . Financial resource strain: Not hard at all  . Food insecurity    Worry: Never true    Inability: Never true  . Transportation needs    Medical: No    Non-medical: No  Tobacco Use  . Smoking status: Current Every Day Smoker    Packs/day: 1.00    Years: 46.00    Pack years: 46.00    Types: Cigarettes  . Smokeless tobacco: Never Used  Substance and Sexual Activity  . Alcohol use: Yes    Alcohol/week: 14.0 standard drinks    Types: 14 Glasses of wine per week  . Drug use: No  . Sexual activity: Not Currently    Comment: pt states very seldem  Lifestyle  . Physical activity    Days per week: 3 days    Minutes per session: 60 min  . Stress: Not at all  Relationships  . Social  connections    Talks on phone: More than three times a week    Gets together: More than three times a week    Attends religious service: Never    Active member of club or organization: No    Attends meetings of clubs or organizations: Never    Relationship status: Divorced  Other Topics Concern  . Not on file  Social History Narrative  . Not on file    Outpatient Encounter Medications as of 05/13/2019  Medication Sig  . amLODipine (NORVASC) 5 MG tablet Take 1 tablet (5 mg total) by mouth daily.  Marland Kitchen aspirin EC 81 MG tablet Take 81 mg by mouth daily.  Marland Kitchen atorvastatin (LIPITOR) 20 MG tablet TAKE 1 TABLET BY MOUTH EVERY DAY  . lisinopril (ZESTRIL) 10 MG tablet Take 20 mg by mouth daily.  Marland Kitchen omeprazole (PRILOSEC) 20 MG capsule Take 1 capsule (20 mg total) by mouth daily.  . [DISCONTINUED] UNABLE TO FIND Folate 1333 MCG  DFE 800 MCG   No facility-administered encounter medications on file as of 05/13/2019.     Activities of Daily Living In your present state of health, do you have any difficulty performing the following activities: 05/13/2019 08/25/2018  Hearing? N N  Comment no hearing aids -  Vision? N N  Comment reading eyeglasses, [facility] -  Difficulty concentrating or making decisions? N N  Walking or climbing stairs? N N  Dressing or bathing? N N  Doing errands, shopping? N N  Preparing Food and eating ? N -  Using the Toilet? N -  In the past six months, have you accidently leaked urine? N -  Do you have problems with loss of bowel control? N -  Managing your Medications? N -  Managing your Finances? N -  Housekeeping or managing your Housekeeping? N -  Some recent data might be hidden    Patient Care Team: Guadalupe Maple, MD as PCP - General (Family Medicine) Abbie Sons, MD (Urology)   Assessment:   This is a routine wellness examination for [name].  Exercise Activities and Dietary recommendations Current Exercise Habits: Home exercise routine, Type of exercise: walking, Time (Minutes): 60, Frequency (Times/Week): 3, Weekly Exercise (Minutes/Week): 180, Intensity: Mild, Exercise limited by: None identified  Goals    . Quit smoking / using tobacco       Fall Risk: Fall Risk  05/13/2019 08/25/2018 02/20/2018 10/18/2017 10/26/2015  Falls in the past year? 0 0 No No No  Number falls in past yr: 0 0 - - -  Injury with Fall? 0 0 - - -    FALL RISK PREVENTION PERTAINING TO THE HOME:  Any stairs in or around the home? Yes  If so, are there any without handrails? No   Home free of loose throw rugs in walkways, pet beds, electrical cords, etc? Yes  Adequate lighting in your home to reduce risk of falls? Yes   ASSISTIVE DEVICES UTILIZED TO PREVENT FALLS:  Life alert? No  Use of a cane, walker or w/c? No  Grab bars in the bathroom? No  Shower chair or bench in  shower? No  Elevated toilet seat or a handicapped toilet? No   TIMED UP AND GO:  Unable to perform   Depression Screen PHQ 2/9 Scores 05/13/2019 02/20/2018 10/18/2017 10/30/2016  PHQ - 2 Score 0 0 0 0  PHQ- 9 Score - 0 - 0    Cognitive Function     6CIT Screen  02/20/2018  What Year? 0 points  What month? 0 points  What time? 0 points  Count back from 20 0 points  Months in reverse 0 points  Repeat phrase 0 points  Total Score 0    Immunization History  Administered Date(s) Administered  . Influenza, High Dose Seasonal PF 02/20/2018  . Influenza,inj,Quad PF,6+ Mos 04/30/2016  . Pneumococcal Conjugate-13 10/18/2017  . Pneumococcal Polysaccharide-23 01/05/2013  . Tdap 03/10/2009  . Zoster 01/05/2013    Qualifies for Shingles Vaccine? Yes  Zostavax completed n/a. Due for Shingrix. Education has been provided regarding the importance of this vaccine. Pt has been advised to call insurance company to determine out of pocket expense. Advised may also receive vaccine at local pharmacy or Health Dept. Verbalized acceptance and understanding.  Tdap: Discussed need for TD/TDAP vaccine, patient verbalized understanding that this is not covered as a preventative with there insurance and to call the office if he develops any new skin injuries, ie: cuts, scrapes, bug bites, or open wounds.  Flu Vaccine: due, will schedule appt to get.   Pneumococcal Vaccine: Due for Pneumococcal vaccine. Patient will schedule   Screening Tests Health Maintenance  Topic Date Due  . PNA vac Low Risk Adult (2 of 2 - PPSV23) 10/19/2018  . INFLUENZA VACCINE  01/24/2019  . TETANUS/TDAP  03/11/2019  . COLONOSCOPY  09/12/2024  . Hepatitis C Screening  Completed   Cancer Screenings:  Colorectal Screening: Completed 2016 Repeat every 10 years  Lung Cancer Screening: (Low Dose CT Chest recommended if Age 26-80 years, 30 pack-year currently smoking OR have quit w/in 15years.) does qualify.   Completed  10/2018   Additional Screening:  Hepatitis C Screening: does qualify; Completed 2017  Vision Screening: Recommended annual ophthalmology exams for early detection of glaucoma and other disorders of the eye. Is the patient up to date with their annual eye exam?  Yes  Who is the provider or what is the name of the office in which the pt attends annual eye exams? [name]   Dental Screening: Recommended annual dental exams for proper oral hygiene  Community Resource Referral:  CRR required this visit?  No        Plan:  I have personally reviewed and addressed the Medicare Annual Wellness questionnaire and have noted the following in the patient's chart:  A. Medical and social history B. Use of alcohol, tobacco or illicit drugs  C. Current medications and supplements D. Functional ability and status E.  Nutritional status F.  Physical activity G. Advance directives H. List of other physicians I.  Hospitalizations, surgeries, and ER visits in previous 12 months J.  [place] such as hearing and vision if needed, cognitive and depression L. Referrals and appointments   In addition, I have reviewed and discussed with patient certain preventive protocols, quality metrics, and best practice recommendations. A written personalized care plan for preventive services as well as general preventive health recommendations were provided to patient.   Signed,   Bevelyn Ngo, LPN  [id number] Nurse Health Advisor   Nurse Notes: none

## 2019-05-13 NOTE — Patient Instructions (Signed)
Billy Duncan , Thank you for taking time to come for your Medicare Wellness Visit. I appreciate your ongoing commitment to your health goals. Please review the following plan we discussed and let me know if I can assist you in the future.   Screening recommendations/referrals: Colonoscopy: up to date Recommended yearly ophthalmology/optometry visit for glaucoma screening and checkup Recommended yearly dental visit for hygiene and checkup  Vaccinations: Influenza vaccine: due now Pneumococcal vaccine: due now Tdap vaccine: up to date Shingles vaccine: shingrix eligible    Advanced directives: please pick up a copy of this information next time your in the office  Conditions/risks identified: If you wish to quit smoking, help is available. For free tobacco cessation program offerings call the [facility] at 781-679-3056 or Live Well Line at 365-321-4354. You may also visit [email or web address] or email [email or web address] .com for more information on other programs.   Next appointment: Follow up in one year for your annual wellness visit   Preventive Care 65 Years and Older, Male Preventive care refers to lifestyle choices and visits with your health care provider that can promote health and wellness. What does preventive care include?  A yearly physical exam. This is also called an annual well check.  Dental exams once or twice a year.  Routine eye exams. Ask your health care provider how often you should have your eyes checked.  Personal lifestyle choices, including:  Daily care of your teeth and gums.  Regular physical activity.  Eating a healthy diet.  Avoiding tobacco and drug use.  Limiting alcohol use.  Practicing safe sex.  Taking low doses of aspirin every day.  Taking vitamin and mineral supplements as recommended by your health care provider. What happens during an annual well check? The services and screenings done by your health care  provider during your annual well check will depend on your age, overall health, lifestyle risk factors, and family history of disease. Counseling  Your health care provider may ask you questions about your:  Alcohol use.  Tobacco use.  Drug use.  Emotional well-being.  Home and relationship well-being.  Sexual activity.  Eating habits.  History of falls.  Memory and ability to understand (cognition).  Work and work Statistician. Screening  You may have the following tests or measurements:  Height, weight, and BMI.  Blood pressure.  Lipid and cholesterol levels. These may be checked every 5 years, or more frequently if you are over 84 years old.  Skin check.  Lung cancer screening. You may have this screening every year starting at age 15 if you have a 30-pack-year history of smoking and currently smoke or have quit within the past 15 years.  Fecal occult blood test (FOBT) of the stool. You may have this test every year starting at age 41.  Flexible sigmoidoscopy or colonoscopy. You may have a sigmoidoscopy every 5 years or a colonoscopy every 10 years starting at age 27.  Prostate cancer screening. Recommendations will vary depending on your family history and other risks.  Hepatitis C blood test.  Hepatitis B blood test.  Sexually transmitted disease (STD) testing.  Diabetes screening. This is done by checking your blood sugar (glucose) after you have not eaten for a while (fasting). You may have this done every 1-3 years.  Abdominal aortic aneurysm (AAA) screening. You may need this if you are a current or former smoker.  Osteoporosis. You may be screened starting at age 58 if you are at high risk. Talk  with your health care provider about your test results, treatment options, and if necessary, the need for more tests. Vaccines  Your health care provider may recommend certain vaccines, such as:  Influenza vaccine. This is recommended every year.  Tetanus,  diphtheria, and acellular pertussis (Tdap, Td) vaccine. You may need a Td booster every 10 years.  Zoster vaccine. You may need this after age 37.  Pneumococcal 13-valent conjugate (PCV13) vaccine. One dose is recommended after age 10.  Pneumococcal polysaccharide (PPSV23) vaccine. One dose is recommended after age 3. Talk to your health care provider about which screenings and vaccines you need and how often you need them. This information is not intended to replace advice given to you by your health care provider. Make sure you discuss any questions you have with your health care provider. Document Released: 07/08/2015 Document Revised: 02/29/2016 Document Reviewed: 04/12/2015 Elsevier Interactive Patient Education  2017 [place] Prevention in the Home Falls can cause injuries. They can happen to people of all ages. There are many things you can do to make your home safe and to help prevent falls. What can I do on the outside of my home?  Regularly fix the edges of walkways and driveways and fix any cracks.  Remove anything that might make you trip as you walk through a door, such as a raised step or threshold.  Trim any bushes or trees on the path to your home.  Use bright outdoor lighting.  Clear any walking paths of anything that might make someone trip, such as rocks or tools.  Regularly check to see if handrails are loose or broken. Make sure that both sides of any steps have handrails.  Any raised decks and porches should have guardrails on the edges.  Have any leaves, snow, or ice cleared regularly.  Use sand or salt on walking paths during winter.  Clean up any spills in your garage right away. This includes oil or grease spills. What can I do in the bathroom?  Use night lights.  Install grab bars by the toilet and in the tub and shower. Do not use towel bars as grab bars.  Use non-skid mats or decals in the tub or shower.  If you need to sit down in  the shower, use a plastic, non-slip stool.  Keep the floor dry. Clean up any water that spills on the floor as soon as it happens.  Remove soap buildup in the tub or shower regularly.  Attach bath mats securely with double-sided non-slip rug tape.  Do not have throw rugs and other things on the floor that can make you trip. What can I do in the bedroom?  Use night lights.  Make sure that you have a light by your bed that is easy to reach.  Do not use any sheets or blankets that are too big for your bed. They should not hang down onto the floor.  Have a firm chair that has side arms. You can use this for support while you get dressed.  Do not have throw rugs and other things on the floor that can make you trip. What can I do in the kitchen?  Clean up any spills right away.  Avoid walking on wet floors.  Keep items that you use a lot in easy-to-reach places.  If you need to reach something above you, use a strong step stool that has a grab bar.  Keep electrical cords out of the way.  Do  not use floor polish or wax that makes floors slippery. If you must use wax, use non-skid floor wax.  Do not have throw rugs and other things on the floor that can make you trip. What can I do with my stairs?  Do not leave any items on the stairs.  Make sure that there are handrails on both sides of the stairs and use them. Fix handrails that are broken or loose. Make sure that handrails are as long as the stairways.  Check any carpeting to make sure that it is firmly attached to the stairs. Fix any carpet that is loose or worn.  Avoid having throw rugs at the top or bottom of the stairs. If you do have throw rugs, attach them to the floor with carpet tape.  Make sure that you have a light switch at the top of the stairs and the bottom of the stairs. If you do not have them, ask someone to add them for you. What else can I do to help prevent falls?  Wear shoes that:  Do not have high  heels.  Have rubber bottoms.  Are comfortable and fit you well.  Are closed at the toe. Do not wear sandals.  If you use a stepladder:  Make sure that it is fully opened. Do not climb a closed stepladder.  Make sure that both sides of the stepladder are locked into place.  Ask someone to hold it for you, if possible.  Clearly mark and make sure that you can see:  Any grab bars or handrails.  First and last steps.  Where the edge of each step is.  Use tools that help you move around (mobility aids) if they are needed. These include:  Canes.  Walkers.  Scooters.  Crutches.  Turn on the lights when you go into a dark area. Replace any light bulbs as soon as they burn out.  Set up your furniture so you have a clear path. Avoid moving your furniture around.  If any of your floors are uneven, fix them.  If there are any pets around you, be aware of where they are.  Review your medicines with your doctor. Some medicines can make you feel dizzy. This can increase your chance of falling. Ask your doctor what other things that you can do to help prevent falls. This information is not intended to replace advice given to you by your health care provider. Make sure you discuss any questions you have with your health care provider. Document Released: 04/07/2009 Document Revised: 11/17/2015 Document Reviewed: 07/16/2014 Elsevier Interactive Patient Education  2017 [employer].

## 2019-06-13 ENCOUNTER — Other Ambulatory Visit: Payer: Self-pay | Admitting: Nurse Practitioner

## 2019-09-26 ENCOUNTER — Other Ambulatory Visit: Payer: Self-pay | Admitting: Unknown Physician Specialty

## 2019-09-26 NOTE — Telephone Encounter (Signed)
Requested  medications are due for refill today yes  Requested medications are on the active medication list yes  Last refill 1/7  Last visit 08/2018  Future visit scheduled 04/2020  Notes to clinic failed protocol due to visit more than 12 months ago. (was seen by LPN in between)

## 2019-09-28 ENCOUNTER — Other Ambulatory Visit: Payer: Self-pay | Admitting: Nurse Practitioner

## 2019-09-28 NOTE — Telephone Encounter (Signed)
Patient not seen since 08/25/2018. Needs appointment.

## 2019-09-28 NOTE — Telephone Encounter (Signed)
Pt scheduled for 04/12

## 2019-09-28 NOTE — Telephone Encounter (Signed)
Requested medication (s) are due for refill today:   Not sure but probably  Requested medication (s) are on the active medication list:   Yes  Future visit scheduled:   Yes in 7 months   Last ordered: 01/03/2019  Clinic note:   Returned because prescribed by a historical provider.   Requested Prescriptions  Pending Prescriptions Disp Refills   lisinopril (ZESTRIL) 10 MG tablet [Pharmacy Med Name: LISINOPRIL 10 MG TABLET] 180 tablet 3    Sig: TAKE 2 TABLETS BY MOUTH EVERY DAY      Cardiovascular:  ACE Inhibitors Failed - 09/28/2019  1:28 AM      Failed - Cr in normal range and within 180 days    Creatinine, Ser  Date Value Ref Range Status  08/25/2018 1.20 0.76 - 1.27 mg/dL Final          Failed - K in normal range and within 180 days    Potassium  Date Value Ref Range Status  08/25/2018 4.7 3.5 - 5.2 mmol/L Final          Passed - Patient is not pregnant      Passed - Last BP in normal range    BP Readings from Last 1 Encounters:  05/13/19 124/67          Passed - Valid encounter within last 6 months    Recent Outpatient Visits           1 year ago Essential hypertension   [place], Henrine Screws T, NP   1 year ago Essential hypertension   [place] Trinna Post, PA-C   1 year ago Essential hypertension   [place] Kathrine Haddock, NP   1 year ago Need for pneumococcal vaccination   [facility] Kathrine Haddock, NP   2 years ago Essential hypertension   [facility] Kathrine Haddock, NP       Future Appointments             In 5 months Stoioff, Ronda Fairly, MD [place]   In 7 months  [facility], [place]

## 2019-09-28 NOTE — Telephone Encounter (Signed)
LOV: 08/25/2018 with Marnee Guarneri, NP; NOV: not scheduled.

## 2019-09-29 ENCOUNTER — Other Ambulatory Visit: Payer: Self-pay | Admitting: Nurse Practitioner

## 2019-09-29 NOTE — Telephone Encounter (Signed)
Requested Prescriptions  Pending Prescriptions Disp Refills  . amLODipine (NORVASC) 5 MG tablet [Pharmacy Med Name: AMLODIPINE BESYLATE 5 MG TAB] 30 tablet 0    Sig: TAKE 1 TABLET BY MOUTH EVERY DAY     Cardiovascular:  Calcium Channel Blockers Passed - 09/29/2019  1:25 AM      Passed - Last BP in normal range    BP Readings from Last 1 Encounters:  05/13/19 124/67         Passed - Valid encounter within last 6 months    Recent Outpatient Visits          1 year ago Essential hypertension   [place], Henrine Screws T, NP   1 year ago Essential hypertension   [facility] Trinna Post, PA-C   1 year ago Essential hypertension   [facility] Kathrine Haddock, NP   1 year ago Need for pneumococcal vaccination   [facility] Kathrine Haddock, NP   2 years ago Essential hypertension   [facility] Kathrine Haddock, NP      Future Appointments            In 6 days Cannady, Barbaraann Faster, NP [employer]   In 5 months Stoioff, Ronda Fairly, MD [place]   In 7 months  [employer], [place]

## 2019-10-01 ENCOUNTER — Encounter: Payer: Self-pay | Admitting: Nurse Practitioner

## 2019-10-01 DIAGNOSIS — J432 Centrilobular emphysema: Secondary | ICD-10-CM | POA: Insufficient documentation

## 2019-10-05 ENCOUNTER — Encounter: Payer: Self-pay | Admitting: Nurse Practitioner

## 2019-10-05 ENCOUNTER — Other Ambulatory Visit: Payer: Self-pay

## 2019-10-05 ENCOUNTER — Ambulatory Visit (INDEPENDENT_AMBULATORY_CARE_PROVIDER_SITE_OTHER): Payer: Medicare Other | Admitting: Nurse Practitioner

## 2019-10-05 VITALS — BP 122/68 | HR 96 | Temp 98.3°F | Wt 175.8 lb

## 2019-10-05 DIAGNOSIS — E782 Mixed hyperlipidemia: Secondary | ICD-10-CM

## 2019-10-05 DIAGNOSIS — I700 Atherosclerosis of aorta: Secondary | ICD-10-CM

## 2019-10-05 DIAGNOSIS — J432 Centrilobular emphysema: Secondary | ICD-10-CM

## 2019-10-05 DIAGNOSIS — N1831 Chronic kidney disease, stage 3a: Secondary | ICD-10-CM | POA: Diagnosis not present

## 2019-10-05 DIAGNOSIS — F17210 Nicotine dependence, cigarettes, uncomplicated: Secondary | ICD-10-CM | POA: Diagnosis not present

## 2019-10-05 DIAGNOSIS — K219 Gastro-esophageal reflux disease without esophagitis: Secondary | ICD-10-CM | POA: Diagnosis not present

## 2019-10-05 DIAGNOSIS — I10 Essential (primary) hypertension: Secondary | ICD-10-CM

## 2019-10-05 MED ORDER — AMLODIPINE BESYLATE 5 MG PO TABS: 5.0000 mg | ORAL_TABLET | Freq: Every day | ORAL | 4 refills | Status: DC

## 2019-10-05 NOTE — Progress Notes (Signed)
BP 122/68   Pulse 96   Temp 98.3 F (36.8 C) (Oral)   Wt 175 lb 12.8 oz (79.7 kg)   SpO2 100%   BMI 23.84 kg/m    Subjective:    Patient ID: Billy Duncan, male    DOB: [birth year], 69 y.o.   MRN: [id number]  HPI: Billy Duncan is a 69 y.o. male  Chief Complaint  Patient presents with  . Hyperlipidemia  . Hypertension  . Gastroesophageal Reflux   HYPERTENSION / HYPERLIPIDEMIA Currently takes Lisinopril and Amlodipine for HTN.  Lipitor for HLD. Satisfied with current treatment? yes Duration of hypertension: chronic BP monitoring frequency: not checking BP range:  BP medication side effects: no Duration of hyperlipidemia: chronic Cholesterol medication side effects: no Cholesterol supplements: none Medication compliance: good compliance Aspirin: yes Recent stressors: no Recurrent headaches: no Visual changes: no Palpitations: no Dyspnea: no Chest pain: no Lower extremity edema: no Dizzy/lightheaded: no The ASCVD Risk score Billy Duncan., et al., 2013) failed to calculate for the following reasons:   The valid HDL cholesterol range is 20 to 100 mg/dL   GERD Currently takes Omeprazole daily. GERD control status: controlled  Satisfied with current treatment? yes Heartburn frequency: none Medication side effects: no  Medication compliance: good Previous GERD medications: none Antacid use frequency:  none Duration: years Nature:  Location:  Heartburn duration:  Alleviatiating factors:   Aggravating factors:  Dysphagia: no Odynophagia:  no Hematemesis: no Blood in stool: no EGD: no  COPD Had initial lung CT screen on 12/12/18 and noted centrilobular and paraseptal  emphysema.  He continues to smoke, smokes < 1 PPD, not interested in quitting at this time. COPD status: stable Satisfied with current treatment?: yes Oxygen use: no Dyspnea frequency:  Cough frequency:  Rescue inhaler frequency:   Limitation of activity: no Productive cough:    Last Spirometry:  Pneumovax: Up to Date Influenza: Up to Date  Relevant past medical, surgical, family and social history reviewed and updated as indicated. Interim medical history since our last visit reviewed. Allergies and medications reviewed and updated.  Review of Systems  Constitutional: Negative for activity change, diaphoresis, fatigue and fever.  Respiratory: Negative for cough, chest tightness, shortness of breath and wheezing.   Cardiovascular: Negative for chest pain, palpitations and leg swelling.  Gastrointestinal: Negative.   Psychiatric/Behavioral: Negative.     Per HPI unless specifically indicated above     Objective:    BP 122/68   Pulse 96   Temp 98.3 F (36.8 C) (Oral)   Wt 175 lb 12.8 oz (79.7 kg)   SpO2 100%   BMI 23.84 kg/m   Wt Readings from Last 3 Encounters:  10/05/19 175 lb 12.8 oz (79.7 kg)  05/13/19 178 lb (80.7 kg)  03/16/19 178 lb 9.6 oz (81 kg)    Physical Exam Vitals and nursing note reviewed.  Constitutional:      General: He is awake. He is not in acute distress.    Appearance: He is well-developed and well-groomed. He is not ill-appearing.  HENT:     Head: Normocephalic and atraumatic.     Right Ear: Hearing normal. No drainage.     Left Ear: Hearing normal. No drainage.  Eyes:     General: Lids are normal.        Right eye: No discharge.        Left eye: No discharge.     Conjunctiva/sclera: Conjunctivae normal.     Pupils: Pupils are equal,  round, and reactive to light.  Neck:     Vascular: No carotid bruit.  Cardiovascular:     Rate and Rhythm: Normal rate and regular rhythm.     Heart sounds: Normal heart sounds, S1 normal and S2 normal. No murmur. No gallop.   Pulmonary:     Effort: Pulmonary effort is normal. No accessory muscle usage or respiratory distress.     Breath sounds: Normal breath sounds.  Abdominal:     General: Bowel sounds are normal.     Palpations: Abdomen is soft.  Musculoskeletal:         General: Normal range of motion.     Cervical back: Normal range of motion and neck supple.     Right lower leg: No edema.     Left lower leg: No edema.  Skin:    General: Skin is warm and dry.     Capillary Refill: Capillary refill takes less than 2 seconds.  Neurological:     Mental Status: He is alert and oriented to person, place, and time.  Psychiatric:        Attention and Perception: Attention normal.        Mood and Affect: Mood normal.        Speech: Speech normal.        Behavior: Behavior normal. Behavior is cooperative.        Thought Content: Thought content normal.     Results for orders placed or performed in visit on 03/16/19  PSA  Result Value Ref Range   Prostate Specific Ag, Serum <0.1 0.0 - 4.0 ng/mL      Assessment & Plan:   Problem List Items Addressed This Visit      Cardiovascular and Mediastinum   Hypertension    Chronic, ongoing.  BP at goal today.  Continue current medication regimen and adjust as needed.  BMP today. Recommend he check BP at few days a week at home and document for provider visits.  Refills sent.  Return in 6 months.      Relevant Medications   amLODipine (NORVASC) 5 MG tablet   Other Relevant Orders   Basic metabolic panel   TSH   Atherosclerosis of aorta (Billy Duncan)    Noted on lung CT screening.  Recommend complete cessation of smoking.  Continue daily statin and ASA for prevention.      Relevant Medications   amLODipine (NORVASC) 5 MG tablet     Respiratory   Centrilobular emphysema ([place]) - Primary    Ongoing, noted on lung CT screening.  No current inhalers or symptoms.  Recommend complete smoking cessation to assist in slowing down progression of disease progress.  Continue yearly screening and initiate inhalers as needed.  Consider spirometry at next visit.        Digestive   GERD (gastroesophageal reflux disease)    Chronic, ongoing.  Continue current medication regimen and adjust as needed.  Mag level today.       Relevant Orders   Magnesium     Genitourinary   Stage 3 chronic kidney disease    Improved last labs with GFR 63 and CRT 1.20.  Recheck BMP today.        Other   Nicotine dependence, cigarettes, uncomplicated    I have recommended complete cessation of tobacco use. I have discussed various options available for assistance with tobacco cessation including over the counter methods (Nicotine gum, patch and lozenges). We also discussed prescription options (Chantix, Nicotine Inhaler / Nasal  Spray). The patient is not interested in pursuing any prescription tobacco cessation options at this time.       Mixed hyperlipidemia    Chronic, ongoing.  Continue current medication regimen and adjust as needed.  Lipid panel today.      Relevant Medications   amLODipine (NORVASC) 5 MG tablet   Other Relevant Orders   Lipid Panel w/o Chol/HDL Ratio       Follow up plan: Return in about 6 months (around 04/05/2020) for HTN/HLD, COPD, GERD.

## 2019-10-05 NOTE — Assessment & Plan Note (Signed)
I have recommended complete cessation of tobacco use. I have discussed various options available for assistance with tobacco cessation including over the counter methods (Nicotine gum, patch and lozenges). We also discussed prescription options (Chantix, Nicotine Inhaler / Nasal Spray). The patient is not interested in pursuing any prescription tobacco cessation options at this time.  

## 2019-10-05 NOTE — Assessment & Plan Note (Signed)
Chronic, ongoing.  BP at goal today.  Continue current medication regimen and adjust as needed.  BMP today. Recommend he check BP at few days a week at home and document for provider visits.  Refills sent.  Return in 6 months.

## 2019-10-05 NOTE — Assessment & Plan Note (Signed)
Noted on lung CT screening.  Recommend complete cessation of smoking.  Continue daily statin and ASA for prevention. °

## 2019-10-05 NOTE — Patient Instructions (Signed)
Coping with Quitting Smoking  Quitting smoking is a physical and mental challenge. You will face cravings, withdrawal symptoms, and temptation. Before quitting, work with your health care provider to make a plan that can help you cope. Preparation can help you quit and keep you from giving in. How can I cope with cravings? Cravings usually last for 5-10 minutes. If you get through it, the craving will pass. Consider taking the following actions to help you cope with cravings:  Keep your mouth busy: ? Chew sugar-free gum. ? Suck on hard candies or a straw. ? Brush your teeth.  Keep your hands and body busy: ? Immediately change to a different activity when you feel a craving. ? Squeeze or play with a ball. ? Do an activity or a hobby, like making bead jewelry, practicing needlepoint, or working with wood. ? Mix up your normal routine. ? Take a short exercise break. Go for a quick walk or run up and down stairs. ? Spend time in public places where smoking is not allowed.  Focus on doing something kind or helpful for someone else.  Call a friend or family member to talk during a craving.  Join a support group.  Call a quit line, such as 1-800-QUIT-NOW.  Talk with your health care provider about medicines that might help you cope with cravings and make quitting easier for you. How can I deal with withdrawal symptoms? Your body may experience negative effects as it tries to get used to not having nicotine in the system. These effects are called withdrawal symptoms. They may include:  Feeling hungrier than normal.  Trouble concentrating.  Irritability.  Trouble sleeping.  Feeling depressed.  Restlessness and agitation.  Craving a cigarette. To manage withdrawal symptoms:  Avoid places, people, and activities that trigger your cravings.  Remember why you want to quit.  Get plenty of sleep.  Avoid coffee and other caffeinated drinks. These may worsen some of your  symptoms. How can I handle social situations? Social situations can be difficult when you are quitting smoking, especially in the first few weeks. To manage this, you can:  Avoid parties, bars, and other social situations where people might be smoking.  Avoid alcohol.  Leave right away if you have the urge to smoke.  Explain to your family and friends that you are quitting smoking. Ask for understanding and support.  Plan activities with friends or family where smoking is not an option. What are some ways I can cope with stress? Wanting to smoke may cause stress, and stress can make you want to smoke. Find ways to manage your stress. Relaxation techniques can help. For example:  Breathe slowly and deeply, in through your nose and out through your mouth.  Listen to soothing, relaxing music.  Talk with a family member or friend about your stress.  Light a candle.  Soak in a bath or take a shower.  Think about a peaceful place. What are some ways I can prevent weight gain? Be aware that many people gain weight after they quit smoking. However, not everyone does. To keep from gaining weight, have a plan in place before you quit and stick to the plan after you quit. Your plan should include:  Having healthy snacks. When you have a craving, it may help to: ? Eat plain popcorn, crunchy carrots, celery, or other cut vegetables. ? Chew sugar-free gum.  Changing how you eat: ? Eat small portion sizes at meals. ? Eat 4-6 small meals   throughout the day instead of 1-2 large meals a day. ? Be mindful when you eat. Do not watch television or do other things that might distract you as you eat.  Exercising regularly: ? Make time to exercise each day. If you do not have time for a long workout, do short bouts of exercise for 5-10 minutes several times a day. ? Do some form of strengthening exercise, like weight lifting, and some form of aerobic exercise, like running or swimming.  Drinking  plenty of water or other low-calorie or no-calorie drinks. Drink 6-8 glasses of water daily, or as much as instructed by your health care provider. Summary  Quitting smoking is a physical and mental challenge. You will face cravings, withdrawal symptoms, and temptation to smoke again. Preparation can help you as you go through these challenges.  You can cope with cravings by keeping your mouth busy (such as by chewing gum), keeping your body and hands busy, and making calls to family, friends, or a helpline for people who want to quit smoking.  You can cope with withdrawal symptoms by avoiding places where people smoke, avoiding drinks with caffeine, and getting plenty of rest.  Ask your health care provider about the different ways to prevent weight gain, avoid stress, and handle social situations. This information is not intended to replace advice given to you by your health care provider. Make sure you discuss any questions you have with your health care provider. Document Revised: 05/24/2017 Document Reviewed: 06/08/2016 Elsevier Patient Education  2020 Elsevier Inc.  

## 2019-10-05 NOTE — Assessment & Plan Note (Signed)
Ongoing, noted on lung CT screening.  No current inhalers or symptoms.  Recommend complete smoking cessation to assist in slowing down progression of disease progress.  Continue yearly screening and initiate inhalers as needed.  Consider spirometry at next visit.

## 2019-10-05 NOTE — Assessment & Plan Note (Signed)
Improved last labs with GFR 63 and CRT 1.20.  Recheck BMP today.

## 2019-10-05 NOTE — Assessment & Plan Note (Signed)
Chronic, ongoing.  Continue current medication regimen and adjust as needed. Lipid panel today. 

## 2019-10-05 NOTE — Assessment & Plan Note (Signed)
Chronic, ongoing.  Continue current medication regimen and adjust as needed.  Mag level today. 

## 2019-10-06 ENCOUNTER — Telehealth: Payer: Self-pay | Admitting: Nurse Practitioner

## 2019-10-06 DIAGNOSIS — E871 Hypo-osmolality and hyponatremia: Secondary | ICD-10-CM

## 2019-10-06 LAB — BASIC METABOLIC PANEL
BUN/Creatinine Ratio: 7 — ABNORMAL LOW (ref 10–24)
BUN: 9 mg/dL (ref 8–27)
CO2: 18 mmol/L — ABNORMAL LOW (ref 20–29)
Calcium: 10.2 mg/dL (ref 8.6–10.2)
Chloride: 95 mmol/L — ABNORMAL LOW (ref 96–106)
Creatinine, Ser: 1.21 mg/dL (ref 0.76–1.27)
GFR calc Af Amer: 71 mL/min/{1.73_m2} (ref >59)
GFR calc non Af Amer: 62 mL/min/{1.73_m2} (ref >59)
Glucose: 97 mg/dL (ref 65–99)
Potassium: 4.3 mmol/L (ref 3.5–5.2)
Sodium: 132 mmol/L — ABNORMAL LOW (ref 134–144)

## 2019-10-06 LAB — MAGNESIUM: Magnesium: 1.5 mg/dL — ABNORMAL LOW (ref 1.6–2.3)

## 2019-10-06 LAB — LIPID PANEL W/O CHOL/HDL RATIO
Cholesterol, Total: 170 mg/dL (ref 100–199)
HDL: 116 mg/dL (ref >39)
LDL Chol Calc (NIH): 42 mg/dL (ref 0–99)
Triglycerides: 58 mg/dL (ref 0–149)
VLDL Cholesterol Cal: 12 mg/dL (ref 5–40)

## 2019-10-06 LAB — TSH: TSH: 2.02 u[IU]/mL (ref 0.450–4.500)

## 2019-10-06 NOTE — Telephone Encounter (Signed)
Reviewed labs with patient via telephone.  Discussed lower mag level, recommended taking Mag supplement 400 MG daily.  Discussed low NA+ level, recommend increasing sodium intake slightly on daily basis.  Reviewed remainder of labs.  Will recheck NA+ and mag in 6 weeks on outpatient labs.

## 2019-10-06 NOTE — Progress Notes (Signed)
Review telephone note 10/06/19

## 2019-10-07 NOTE — Telephone Encounter (Signed)
test

## 2019-12-11 ENCOUNTER — Telehealth: Payer: Self-pay | Admitting: *Deleted

## 2019-12-11 DIAGNOSIS — Z87891 Personal history of nicotine dependence: Secondary | ICD-10-CM

## 2019-12-11 DIAGNOSIS — Z122 Encounter for screening for malignant neoplasm of respiratory organs: Secondary | ICD-10-CM

## 2019-12-11 NOTE — Telephone Encounter (Signed)
(  12/11/2019) Pt has been notified that lung cancer screening CT scan is due currently or will be in near future. Confirmed pt is within appropriate age range, and asymptomatic. Pt denies illness that would prevent curative treatment for lung cancer if found. Verified smoking history (Current Smoker,1 ppd ). Pt did receive 2nd COVID VX on (09/14/19) [CT to be scheduled approx. 4 weeks after vx date] Pt is agreeable for CT scan being scheduled, prefers AM appt (10am-11am) SRW

## 2019-12-14 NOTE — Addendum Note (Signed)
Addended by: Lieutenant Diego on: 12/14/2019 03:25 PM   Modules accepted: Orders

## 2019-12-14 NOTE — Telephone Encounter (Signed)
Smoking history: current, 49 pack year

## 2019-12-29 ENCOUNTER — Ambulatory Visit
Admission: RE | Admit: 2019-12-29 | Discharge: 2019-12-29 | Disposition: A | Discharge status: Home / Self Care | Payer: Medicare Other | Source: Ambulatory Visit | Attending: Oncology | Admitting: Oncology

## 2019-12-29 ENCOUNTER — Other Ambulatory Visit: Payer: Self-pay

## 2019-12-29 DIAGNOSIS — F17210 Nicotine dependence, cigarettes, uncomplicated: Secondary | ICD-10-CM | POA: Diagnosis not present

## 2019-12-29 DIAGNOSIS — Z87891 Personal history of nicotine dependence: Secondary | ICD-10-CM | POA: Diagnosis not present

## 2019-12-29 DIAGNOSIS — Z122 Encounter for screening for malignant neoplasm of respiratory organs: Secondary | ICD-10-CM | POA: Diagnosis present

## 2019-12-31 ENCOUNTER — Encounter: Payer: Self-pay | Admitting: *Deleted

## 2020-03-11 ENCOUNTER — Other Ambulatory Visit: Payer: Self-pay

## 2020-03-11 DIAGNOSIS — Z8546 Personal history of malignant neoplasm of prostate: Secondary | ICD-10-CM

## 2020-03-15 ENCOUNTER — Other Ambulatory Visit: Payer: Self-pay

## 2020-03-15 ENCOUNTER — Other Ambulatory Visit: Payer: Medicare Other

## 2020-03-15 DIAGNOSIS — Z8546 Personal history of malignant neoplasm of prostate: Secondary | ICD-10-CM

## 2020-03-16 LAB — PSA: Prostate Specific Ag, Serum: <0.1 ng/mL (ref 0.0–4.0)

## 2020-03-17 NOTE — Progress Notes (Signed)
   03/18/2020 11:17 AM   Billy Duncan Jun 03, 1951 [id number]  Referring provider: Guadalupe Maple, MD No address on file Chief Complaint  Patient presents with  . Other   Urologic history: 1.Prostate cancer - [facility]10/2013 at [facility] - pT2c;focal positive margin left mid;undetectable PSA postop  HPI: Billy Duncan is a 69 y.o. male who presents today for a 1 year follow up of prostate cancer.   -The patient is doing well. -No bothersome urinary symptoms. -No flank, abdomina or pelvic pain. -No hematuria or dysuria. -PSA remains undetectable.    PMH: Past Medical History:  Diagnosis Date  . Hypertension   . Malignant neoplasm prostate ([place])   . Polycythemia   . Tobacco use     Surgical History: Past Surgical History:  Procedure Laterality Date  . FOOT NEUROMA SURGERY Left   . PROSTATECTOMY  03/2012    Home Medications:  Allergies as of 03/18/2020   No Known Allergies     Medication List       Accurate as of March 18, 2020 11:17 AM. If you have any questions, ask your nurse or doctor.        amLODipine 5 MG tablet Commonly known as: NORVASC Take 1 tablet (5 mg total) by mouth daily.   aspirin EC 81 MG tablet Take 81 mg by mouth daily.   atorvastatin 20 MG tablet Commonly known as: LIPITOR TAKE 1 TABLET BY MOUTH EVERY DAY   lisinopril 10 MG tablet Commonly known as: ZESTRIL TAKE 2 TABLETS BY MOUTH EVERY DAY   omeprazole 20 MG capsule Commonly known as: PRILOSEC TAKE 1 CAPSULE BY MOUTH EVERY DAY       Allergies: No Known Allergies  Family History: Family History  Problem Relation Age of Onset  . Heart disease Mother        a-fib  . Dementia Mother   . Stomach cancer Father   . Heart disease Brother   . Heart disease Brother     Social History:  reports that he has been smoking cigarettes. He has a 46.00 pack-year smoking history. He has never used smokeless tobacco. He reports current alcohol use of about 14.0 standard  drinks of alcohol per week. He reports that he does not use drugs.   Physical Exam: BP 113/73   Pulse (!) 120   Ht 6' (1.829 m)   Wt 180 lb (81.6 kg)   BMI 24.41 kg/m   Constitutional:  Alert and oriented, No acute distress. HEENT: [place] AT, moist mucus membranes.  Trachea midline, no masses. Cardiovascular: No clubbing, cyanosis, or edema. Respiratory: Normal respiratory effort, no increased work of breathing. Skin: No rashes, bruises or suspicious lesions. Neurologic: Grossly intact, no focal deficits, moving all 4 extremities. Psychiatric: Normal mood and affect.  Laboratory Data:  Lab Results  Component Value Date   CREATININE 1.21 10/05/2019     Assessment & Plan:    1. Personal history of prostate cancer PSA remains undetectable.  Will continue to monitor PSA annually.    [facility] [address], [place], [place] [address] [phone]  I, Selena Batten, am acting as a scribe for Dr. Nicki Reaper C. Couper J[place],  I have reviewed the above documentation for accuracy and completeness, and I agree with the above.   Abbie Sons, MD

## 2020-03-18 ENCOUNTER — Ambulatory Visit (INDEPENDENT_AMBULATORY_CARE_PROVIDER_SITE_OTHER): Payer: Medicare Other | Admitting: Urology

## 2020-03-18 ENCOUNTER — Encounter: Payer: Self-pay | Admitting: Urology

## 2020-03-18 ENCOUNTER — Other Ambulatory Visit: Payer: Self-pay

## 2020-03-18 VITALS — BP 113/73 | HR 120 | Ht 72.0 in | Wt 180.0 lb

## 2020-03-18 DIAGNOSIS — Z8546 Personal history of malignant neoplasm of prostate: Secondary | ICD-10-CM | POA: Diagnosis not present

## 2020-03-25 ENCOUNTER — Other Ambulatory Visit: Payer: Self-pay | Admitting: Nurse Practitioner

## 2020-05-03 ENCOUNTER — Telehealth: Payer: Self-pay | Admitting: Nurse Practitioner

## 2020-05-03 NOTE — Telephone Encounter (Signed)
Copied from [place]. Topic: Medicare AWV >> May 03, 2020  1:42 PM Weston Anna wrote: Reason for CRM:  Left message to cancel AWVs on Nov 24,2021   New appointment is scheduled for May 13, 2020 by phone at 8:15

## 2020-05-13 ENCOUNTER — Ambulatory Visit (INDEPENDENT_AMBULATORY_CARE_PROVIDER_SITE_OTHER): Payer: Medicare Other

## 2020-05-13 VITALS — Ht 72.0 in | Wt 180.0 lb

## 2020-05-13 DIAGNOSIS — Z0000 Encounter for general adult medical examination without abnormal findings: Secondary | ICD-10-CM

## 2020-05-13 NOTE — Progress Notes (Signed)
I connected with Billy Duncan today by telephone and verified that I am speaking with the correct person using two identifiers. Location patient: home Location provider: work Persons participating in the virtual visit: Anush Wiedeman, Glenna Durand LPN.   I discussed the limitations, risks, security and privacy concerns of performing an evaluation and management service by telephone and the availability of in person appointments. I also discussed with the patient that there may be a patient responsible charge related to this service. The patient expressed understanding and verbally consented to this telephonic visit.    Interactive audio and video telecommunications were attempted between this provider and patient, however failed, due to patient having technical difficulties OR patient did not have access to video capability.  We continued and completed visit with audio only.     Vital signs may be patient reported or missing.  Subjective:   Billy Duncan is a 69 y.o. male who presents for Medicare Annual/Subsequent preventive examination.  Review of Systems     Cardiac Risk Factors include: advanced age (>20men, >23 women);dyslipidemia;hypertension;male gender;smoking/ tobacco exposure     Objective:    Today's Vitals   05/13/20 0817  Weight: 180 lb (81.6 kg)  Height: 6' (1.829 m)   Body mass index is 24.41 kg/m.  Advanced Directives 05/13/2020 05/13/2019 02/20/2018  Does Patient Have a Medical Advance Directive? No No No  Would patient like information on creating a medical advance directive? - - No - Patient declined    Current Medications (verified) Outpatient Encounter Medications as of 05/13/2020  Medication Sig  . amLODipine (NORVASC) 5 MG tablet Take 1 tablet (5 mg total) by mouth daily.  Marland Kitchen aspirin EC 81 MG tablet Take 81 mg by mouth daily.  Marland Kitchen atorvastatin (LIPITOR) 20 MG tablet TAKE 1 TABLET BY MOUTH EVERY DAY  . lisinopril (ZESTRIL) 10 MG tablet TAKE 2  TABLETS BY MOUTH EVERY DAY  . omeprazole (PRILOSEC) 20 MG capsule TAKE 1 CAPSULE BY MOUTH EVERY DAY   No facility-administered encounter medications on file as of 05/13/2020.    Allergies (verified) Patient has no known allergies.   History: Past Medical History:  Diagnosis Date  . Hypertension   . 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 Polycythemia   . Tobacco use    Past Surgical History:  Procedure Laterality Date  . FOOT NEUROMA SURGERY Left   . PROSTATECTOMY  03/2012   Family History  Problem Relation Age of Onset  . Heart disease Mother        a-fib  . Dementia Mother   . Stomach cancer Father   . Heart disease Brother   . Heart disease Brother    Social History   Socioeconomic History  . Marital status: Divorced    Spouse name: Not on file  . Number of children: 2  . Years of education: Not on file  . Highest education level: Some college, no degree  Occupational History  . Not on file  Tobacco Use  . Smoking status: Current Every Day Smoker    Packs/day: 1.00    Years: 46.00    Pack years: 46.00    Types: Cigarettes  . Smokeless tobacco: Never Used  Vaping Use  . Vaping Use: Never used  Substance and Sexual Activity  . Alcohol use: Yes    Alcohol/week: 14.0 standard drinks    Types: 14 Glasses of wine per week  . Drug use: No  . Sexual activity: Not Currently    Comment: pt states  very seldem  Other Topics Concern  . Not on file  Social History Narrative  . Not on file   Social Determinants of Health   Financial Resource Strain: Low Risk   . Difficulty of Paying Living Expenses: Not hard at all  Food Insecurity: No Food Insecurity  . Worried About Charity fundraiser in the Last Year: Never true  . Ran Out of Food in the Last Year: Never true  Transportation Needs: No Transportation Needs  . Lack of Transportation (Medical): No  . Lack of Transportation (Non-Medical): No  Physical Activity: Sufficiently Active  . Days of Exercise per  Week: 3 days  . Minutes of Exercise per Session: 60 min  Stress: No Stress Concern Present  . Feeling of Stress : Not at all  Social Connections:   . Frequency of Communication with Friends and Family: Not on file  . Frequency of Social Gatherings with Friends and Family: Not on file  . Attends Religious Services: Not on file  . Active Member of Clubs or Organizations: Not on file  . Attends Archivist Meetings: Not on file  . Marital Status: Not on file    Tobacco Counseling Ready to quit: No Counseling given: Yes   Clinical Intake:  Pre-visit preparation completed: Yes  Pain : No/denies pain     Nutritional Status: BMI of 19-24  Normal Nutritional Risks: None Diabetes: No  How often do you need to have someone help you when you read instructions, pamphlets, or other written materials from your doctor or pharmacy?: 1 - Never What is the last grade level you completed in school?: 12th grade  Diabetic?no  Interpreter Needed?: No  Information entered by :: NAllen LPN   Activities of Daily Living In your present state of health, do you have any difficulty performing the following activities: 05/13/2020  Hearing? N  Vision? N  Difficulty concentrating or making decisions? N  Walking or climbing stairs? N  Dressing or bathing? N  Doing errands, shopping? N  Preparing Food and eating ? N  Using the Toilet? N  In the past six months, have you accidently leaked urine? Y  Comment incontinence due to prostate cancer  Do you have problems with loss of bowel control? N  Managing your Medications? N  Managing your Finances? N  Housekeeping or managing your Housekeeping? N  Some recent data might be hidden    Patient Care Team: Venita Lick, NP as PCP - General (Nurse Practitioner) Abbie Sons, MD (Urology)  Indicate any recent Medical Services you may have received from other than [facility] providers in the past year (date may be approximate).       Assessment:   This is a routine wellness examination for Billy Duncan.  Hearing/Vision screen  Hearing Screening   125Hz  250Hz  500Hz  1000Hz  2000Hz  3000Hz  4000Hz  6000Hz  8000Hz   Right ear:           Left ear:           Vision Screening Comments: Regular eye exams, [facility]  Dietary issues and exercise activities discussed: Current Exercise Habits: Home exercise routine, Type of exercise: walking, Time (Minutes): 60, Frequency (Times/Week): 3, Weekly Exercise (Minutes/Week): 180  Goals    . Patient Stated     05/13/2020, no goals    . Quit smoking / using tobacco      Depression Screen PHQ 2/9 Scores 05/13/2020 05/13/2019 02/20/2018 10/18/2017 10/30/2016 10/26/2015  PHQ - 2 Score 0 0 0 0  0 0  PHQ- 9 Score - - 0 - 0 -    Fall Risk Fall Risk  05/13/2020 05/13/2019 08/25/2018 02/20/2018 10/18/2017  Falls in the past year? 0 0 0 No No  Number falls in past yr: - 0 0 - -  Injury with Fall? - 0 0 - -  Risk for fall due to : Medication side effect - - - -  Follow up Falls evaluation completed;Education provided;Falls prevention discussed - - - -    Any stairs in or around the home? Yes  If so, are there any without handrails? No  Home free of loose throw rugs in walkways, pet beds, electrical cords, etc? Yes  Adequate lighting in your home to reduce risk of falls? Yes   ASSISTIVE DEVICES UTILIZED TO PREVENT FALLS:  Life alert? No  Use of a cane, walker or w/c? No  Grab bars in the bathroom? Yes  Shower chair or bench in shower? No  Elevated toilet seat or a handicapped toilet? No   TIMED UP AND GO:  Was the test performed? No .   Cognitive Function:     6CIT Screen 05/13/2020 02/20/2018  What Year? 0 points 0 points  What month? 0 points 0 points  What time? 0 points 0 points  Count back from 20 0 points 0 points  Months in reverse 2 points 0 points  Repeat phrase 2 points 0 points  Total Score 4 0    Immunizations Immunization History  Administered Date(s) Administered   . Influenza, High Dose Seasonal PF 02/20/2018  . Influenza,inj,Quad PF,6+ Mos 04/30/2016  . PFIZER SARS-COV-2 Vaccination 08/24/2019, 09/14/2019  . Pneumococcal Conjugate-13 10/18/2017  . Pneumococcal Polysaccharide-23 01/05/2013  . Tdap 03/10/2009  . Zoster 01/05/2013    TDAP status: Due, Education has been provided regarding the importance of this vaccine. Advised may receive this vaccine at local pharmacy or Health Dept. Aware to provide a copy of the vaccination record if obtained from local pharmacy or Health Dept. Verbalized acceptance and understanding. Flu Vaccine status: will get later Pneumococcal vaccine status: Up to date Covid-19 vaccine status: Completed vaccines  Qualifies for Shingles Vaccine? Yes   Zostavax completed Yes   Shingrix Completed?: No.    Education has been provided regarding the importance of this vaccine. Patient has been advised to call insurance company to determine out of pocket expense if they have not yet received this vaccine. Advised may also receive vaccine at local pharmacy or Health Dept. Verbalized acceptance and understanding.  Screening Tests Health Maintenance  Topic Date Due  . INFLUENZA VACCINE  01/24/2020  . TETANUS/TDAP  10/04/2020 (Originally 03/11/2019)  . PNA vac Low Risk Adult (2 of 2 - PPSV23) 10/04/2020 (Originally 10/19/2018)  . COLONOSCOPY  09/12/2024  . COVID-19 Vaccine  Completed  . Hepatitis C Screening  Completed    Health Maintenance  Health Maintenance Due  Topic Date Due  . INFLUENZA VACCINE  01/24/2020    Colorectal cancer screening: Completed 09/13/2014. Repeat every 10 years  Lung Cancer Screening: (Low Dose CT Chest recommended if Age 33-80 years, 30 pack-year currently smoking OR have quit w/in 15years.) does qualify.   Lung Cancer Screening Referral: [facility]scan 12/29/2019  Additional Screening:  Hepatitis C Screening: does qualify; Completed 10/26/2015  Vision Screening: Recommended annual ophthalmology exams  for early detection of glaucoma and other disorders of the eye. Is the patient up to date with their annual eye exam?  No  Who is the provider or what is  the name of the office in which the patient attends annual eye exams? [facility] If pt is not established with a provider, would they like to be referred to a provider to establish care? No .   Dental Screening: Recommended annual dental exams for proper oral hygiene  Community Resource Referral / Chronic Care Management: CRR required this visit?  No   CCM required this visit?  No      Plan:     I have personally reviewed and noted the following in the patient's chart:   . Medical and social history . Use of alcohol, tobacco or illicit drugs  . Current medications and supplements . Functional ability and status . Nutritional status . Physical activity . Advanced directives . List of other physicians . Hospitalizations, surgeries, and ER visits in previous 12 months . Vitals . Screenings to include cognitive, depression, and falls . Referrals and appointments  In addition, I have reviewed and discussed with patient certain preventive protocols, quality metrics, and best practice recommendations. A written personalized care plan for preventive services as well as general preventive health recommendations were provided to patient.     Kellie Simmering, LPN   [id number]   Nurse Notes:

## 2020-05-13 NOTE — Patient Instructions (Signed)
Billy Duncan , Thank you for taking time to come for your Medicare Wellness Visit. I appreciate your ongoing commitment to your health goals. Please review the following plan we discussed and let me know if I can assist you in the future.   Screening recommendations/referrals: Colonoscopy: completed 09/13/2014 Recommended yearly ophthalmology/optometry visit for glaucoma screening and checkup Recommended yearly dental visit for hygiene and checkup  Vaccinations: Influenza vaccine: will get later Pneumococcal vaccine: completed 10/18/2017 Tdap vaccine: due Shingles vaccine: discussed   Covid-19:  09/14/2019, 08/24/2019  Advanced directives: Advance directive discussed with you today. Even though you declined this today please call our office should you change your mind and we can give you the proper paperwork for you to fill out.  Conditions/risks identified: smoking  Next appointment: Follow up in one year for your annual wellness visit.   Preventive Care 68 Years and Older, Male Preventive care refers to lifestyle choices and visits with your health care provider that can promote health and wellness. What does preventive care include?  A yearly physical exam. This is also called an annual well check.  Dental exams once or twice a year.  Routine eye exams. Ask your health care provider how often you should have your eyes checked.  Personal lifestyle choices, including:  Daily care of your teeth and gums.  Regular physical activity.  Eating a healthy diet.  Avoiding tobacco and drug use.  Limiting alcohol use.  Practicing safe sex.  Taking low doses of aspirin every day.  Taking vitamin and mineral supplements as recommended by your health care provider. What happens during an annual well check? The services and screenings done by your health care provider during your annual well check will depend on your age, overall health, lifestyle risk factors, and family history of  disease. Counseling  Your health care provider may ask you questions about your:  Alcohol use.  Tobacco use.  Drug use.  Emotional well-being.  Home and relationship well-being.  Sexual activity.  Eating habits.  History of falls.  Memory and ability to understand (cognition).  Work and work Statistician. Screening  You may have the following tests or measurements:  Height, weight, and BMI.  Blood pressure.  Lipid and cholesterol levels. These may be checked every 5 years, or more frequently if you are over 55 years old.  Skin check.  Lung cancer screening. You may have this screening every year starting at age 68 if you have a 30-pack-year history of smoking and currently smoke or have quit within the past 15 years.  Fecal occult blood test (FOBT) of the stool. You may have this test every year starting at age 68.  Flexible sigmoidoscopy or colonoscopy. You may have a sigmoidoscopy every 5 years or a colonoscopy every 10 years starting at age 68.  Prostate cancer screening. Recommendations will vary depending on your family history and other risks.  Hepatitis C blood test.  Hepatitis B blood test.  Sexually transmitted disease (STD) testing.  Diabetes screening. This is done by checking your blood sugar (glucose) after you have not eaten for a while (fasting). You may have this done every 1-3 years.  Abdominal aortic aneurysm (AAA) screening. You may need this if you are a current or former smoker.  Osteoporosis. You may be screened starting at age 68 if you are at high risk. Talk with your health care provider about your test results, treatment options, and if necessary, the need for more tests. Vaccines  Your health care  provider may recommend certain vaccines, such as:  Influenza vaccine. This is recommended every year.  Tetanus, diphtheria, and acellular pertussis (Tdap, Td) vaccine. You may need a Td booster every 10 years.  Zoster vaccine. You may  need this after age 68.  Pneumococcal 13-valent conjugate (PCV13) vaccine. One dose is recommended after age 1.  Pneumococcal polysaccharide (PPSV23) vaccine. One dose is recommended after age 68. Talk to your health care provider about which screenings and vaccines you need and how often you need them. This information is not intended to replace advice given to you by your health care provider. Make sure you discuss any questions you have with your health care provider. Document Released: 07/08/2015 Document Revised: 02/29/2016 Document Reviewed: 04/12/2015 Elsevier Interactive Patient Education  2017 [place] Prevention in the Home Falls can cause injuries. They can happen to people of all ages. There are many things you can do to make your home safe and to help prevent falls. What can I do on the outside of my home?  Regularly fix the edges of walkways and driveways and fix any cracks.  Remove anything that might make you trip as you walk through a door, such as a raised step or threshold.  Trim any bushes or trees on the path to your home.  Use bright outdoor lighting.  Clear any walking paths of anything that might make someone trip, such as rocks or tools.  Regularly check to see if handrails are loose or broken. Make sure that both sides of any steps have handrails.  Any raised decks and porches should have guardrails on the edges.  Have any leaves, snow, or ice cleared regularly.  Use sand or salt on walking paths during winter.  Clean up any spills in your garage right away. This includes oil or grease spills. What can I do in the bathroom?  Use night lights.  Install grab bars by the toilet and in the tub and shower. Do not use towel bars as grab bars.  Use non-skid mats or decals in the tub or shower.  If you need to sit down in the shower, use a plastic, non-slip stool.  Keep the floor dry. Clean up any water that spills on the floor as soon as it  happens.  Remove soap buildup in the tub or shower regularly.  Attach bath mats securely with double-sided non-slip rug tape.  Do not have throw rugs and other things on the floor that can make you trip. What can I do in the bedroom?  Use night lights.  Make sure that you have a light by your bed that is easy to reach.  Do not use any sheets or blankets that are too big for your bed. They should not hang down onto the floor.  Have a firm chair that has side arms. You can use this for support while you get dressed.  Do not have throw rugs and other things on the floor that can make you trip. What can I do in the kitchen?  Clean up any spills right away.  Avoid walking on wet floors.  Keep items that you use a lot in easy-to-reach places.  If you need to reach something above you, use a strong step stool that has a grab bar.  Keep electrical cords out of the way.  Do not use floor polish or wax that makes floors slippery. If you must use wax, use non-skid floor wax.  Do not have throw  rugs and other things on the floor that can make you trip. What can I do with my stairs?  Do not leave any items on the stairs.  Make sure that there are handrails on both sides of the stairs and use them. Fix handrails that are broken or loose. Make sure that handrails are as long as the stairways.  Check any carpeting to make sure that it is firmly attached to the stairs. Fix any carpet that is loose or worn.  Avoid having throw rugs at the top or bottom of the stairs. If you do have throw rugs, attach them to the floor with carpet tape.  Make sure that you have a light switch at the top of the stairs and the bottom of the stairs. If you do not have them, ask someone to add them for you. What else can I do to help prevent falls?  Wear shoes that:  Do not have high heels.  Have rubber bottoms.  Are comfortable and fit you well.  Are closed at the toe. Do not wear sandals.  If you  use a stepladder:  Make sure that it is fully opened. Do not climb a closed stepladder.  Make sure that both sides of the stepladder are locked into place.  Ask someone to hold it for you, if possible.  Clearly mark and make sure that you can see:  Any grab bars or handrails.  First and last steps.  Where the edge of each step is.  Use tools that help you move around (mobility aids) if they are needed. These include:  Canes.  Walkers.  Scooters.  Crutches.  Turn on the lights when you go into a dark area. Replace any light bulbs as soon as they burn out.  Set up your furniture so you have a clear path. Avoid moving your furniture around.  If any of your floors are uneven, fix them.  If there are any pets around you, be aware of where they are.  Review your medicines with your doctor. Some medicines can make you feel dizzy. This can increase your chance of falling. Ask your doctor what other things that you can do to help prevent falls. This information is not intended to replace advice given to you by your health care provider. Make sure you discuss any questions you have with your health care provider. Document Released: 04/07/2009 Document Revised: 11/17/2015 Document Reviewed: 07/16/2014 Elsevier Interactive Patient Education  2017 [employer].

## 2020-05-18 ENCOUNTER — Ambulatory Visit: Payer: 59

## 2020-06-12 DIAGNOSIS — Z23 Encounter for immunization: Secondary | ICD-10-CM | POA: Diagnosis not present

## 2020-09-23 ENCOUNTER — Other Ambulatory Visit: Payer: Self-pay | Admitting: Nurse Practitioner

## 2020-11-23 ENCOUNTER — Other Ambulatory Visit: Payer: Self-pay | Admitting: Nurse Practitioner

## 2020-11-23 NOTE — Telephone Encounter (Signed)
Lvm to make this apt. 

## 2020-11-23 NOTE — Telephone Encounter (Signed)
Requested medications are due for refill today.  yes  Requested medications are on the active medications list.  yes  Last refill. 09/28/2019  Future visit scheduled.   yes  Notes to clinic.  Prescription is expired.

## 2020-11-24 ENCOUNTER — Other Ambulatory Visit: Payer: Self-pay | Admitting: Nurse Practitioner

## 2020-11-24 NOTE — Telephone Encounter (Signed)
PT Scheduled for apt on 12/12/2020

## 2020-11-24 NOTE — Telephone Encounter (Signed)
Pt scheduled for apt on 12/12/2020

## 2020-11-24 NOTE — Telephone Encounter (Signed)
Requested medications are due for refill today.  Yes  Requested medications are on the active medications list.  yes  Last refill. 09/28/2019  Future visit scheduled.   yes  Notes to clinic.  Prescription is expired.

## 2020-12-12 ENCOUNTER — Ambulatory Visit (INDEPENDENT_AMBULATORY_CARE_PROVIDER_SITE_OTHER): Payer: Medicare Other | Admitting: Nurse Practitioner

## 2020-12-12 ENCOUNTER — Encounter: Payer: Self-pay | Admitting: Nurse Practitioner

## 2020-12-12 ENCOUNTER — Other Ambulatory Visit: Payer: Self-pay

## 2020-12-12 VITALS — BP 107/69 | HR 88 | Temp 97.9°F | Ht 71.3 in | Wt 163.8 lb

## 2020-12-12 DIAGNOSIS — E782 Mixed hyperlipidemia: Secondary | ICD-10-CM

## 2020-12-12 DIAGNOSIS — F17210 Nicotine dependence, cigarettes, uncomplicated: Secondary | ICD-10-CM

## 2020-12-12 DIAGNOSIS — Z23 Encounter for immunization: Secondary | ICD-10-CM

## 2020-12-12 DIAGNOSIS — K219 Gastro-esophageal reflux disease without esophagitis: Secondary | ICD-10-CM

## 2020-12-12 DIAGNOSIS — I700 Atherosclerosis of aorta: Secondary | ICD-10-CM

## 2020-12-12 DIAGNOSIS — H6123 Impacted cerumen, bilateral: Secondary | ICD-10-CM | POA: Diagnosis not present

## 2020-12-12 DIAGNOSIS — N1831 Chronic kidney disease, stage 3a: Secondary | ICD-10-CM

## 2020-12-12 DIAGNOSIS — I10 Essential (primary) hypertension: Secondary | ICD-10-CM | POA: Diagnosis not present

## 2020-12-12 DIAGNOSIS — J432 Centrilobular emphysema: Secondary | ICD-10-CM | POA: Diagnosis not present

## 2020-12-12 MED ORDER — OMEPRAZOLE 20 MG PO CPDR: DELAYED_RELEASE_CAPSULE | ORAL | 1 refills | Status: DC

## 2020-12-12 MED ORDER — AMLODIPINE BESYLATE 5 MG PO TABS: 5.0000 mg | ORAL_TABLET | Freq: Every day | ORAL | 1 refills | Status: DC

## 2020-12-12 NOTE — Progress Notes (Addendum)
Established Patient Office Visit  Subjective:  Patient ID: Billy Duncan, male    DOB: Jul 12, 1950  Age: 70 y.o. MRN: [id number]  CC:  Chief Complaint  Patient presents with   Gastroesophageal Reflux   Hyperlipidemia   Hypertension    HPI WILDER KUROWSKI presents for follow-up on hypertension and hyperlipidemia  HYPERTENSION / HYPERLIPIDEMIA  Satisfied with current treatment? yes Duration of hypertension: chronic BP monitoring frequency: not checking BP range: n/a BP medication side effects: no Past BP meds: amlodipine and lisinopril Duration of hyperlipidemia: chronic Cholesterol medication side effects: no Cholesterol supplements: none Past cholesterol medications: atorvastain (lipitor) Medication compliance: excellent compliance Aspirin: yes Recent stressors: no Recurrent headaches: no Visual changes: no Palpitations: no Dyspnea: no Chest pain: no Lower extremity edema: no Dizzy/lightheaded: no   Past Medical History:  Diagnosis Date   Hypertension    Malignant neoplasm prostate ([place])    Polycythemia    Tobacco use     Past Surgical History:  Procedure Laterality Date   FOOT NEUROMA SURGERY Left    PROSTATECTOMY  03/2012    Family History  Problem Relation Age of Onset   Heart disease Mother        a-fib   Dementia Mother    Stomach cancer Father    Heart disease Brother    Heart disease Brother     Social History   Socioeconomic History   Marital status: Divorced    Spouse name: Not on file   Number of children: 2   Years of education: Not on file   Highest education level: Some college, no degree  Occupational History   Not on file  Tobacco Use   Smoking status: Every Day    Packs/day: 1.00    Years: 46.00    Pack years: 46.00    Types: Cigarettes   Smokeless tobacco: Never  Vaping Use   Vaping Use: Never used  Substance and Sexual Activity   Alcohol use: Yes    Alcohol/week: 14.0 standard drinks    Types: 14 Glasses of  wine per week   Drug use: No   Sexual activity: Not Currently    Comment: pt states very seldem  Other Topics Concern   Not on file  Social History Narrative   Not on file   Social Determinants of Health   Financial Resource Strain: Low Risk    Difficulty of Paying Living Expenses: Not hard at all  Food Insecurity: No Food Insecurity   Worried About Charity fundraiser in the Last Year: Never true   [place] in the Last Year: Never true  Transportation Needs: No Transportation Needs   Lack of Transportation (Medical): No   Lack of Transportation (Non-Medical): No  Physical Activity: Sufficiently Active   Days of Exercise per Week: 3 days   Minutes of Exercise per Session: 60 min  Stress: No Stress Concern Present   Feeling of Stress : Not at all  Social Connections: Not on file  Intimate Partner Violence: Not on file    Outpatient Medications Prior to Visit  Medication Sig Dispense Refill   aspirin EC 81 MG tablet Take 81 mg by mouth daily.     atorvastatin (LIPITOR) 20 MG tablet TAKE 1 TABLET BY MOUTH EVERY DAY 90 tablet 4   lisinopril (ZESTRIL) 10 MG tablet TAKE 2 TABLETS BY MOUTH EVERY DAY 180 tablet 4   amLODipine (NORVASC) 5 MG tablet Take 1 tablet (5 mg total) by mouth  daily. 90 tablet 4   omeprazole (PRILOSEC) 20 MG capsule TAKE 1 CAPSULE BY MOUTH EVERY DAY 90 capsule 0   No facility-administered medications prior to visit.    No Known Allergies  ROS Review of Systems  Constitutional: Negative.   HENT: Negative.    Eyes: Negative.   Respiratory: Negative.    Cardiovascular: Negative.   Gastrointestinal: Negative.   Genitourinary: Negative.   Musculoskeletal: Negative.   Skin: Negative.   Neurological: Negative.   Psychiatric/Behavioral: Negative.       Objective:    Physical Exam Vitals and nursing note reviewed.  Constitutional:      Appearance: Normal appearance.  HENT:     Head: Normocephalic.     Right Ear: There is impacted cerumen.      Left Ear: There is impacted cerumen.  Eyes:     Conjunctiva/sclera: Conjunctivae normal.  Cardiovascular:     Rate and Rhythm: Normal rate and regular rhythm.     Pulses: Normal pulses.     Heart sounds: Normal heart sounds.  Pulmonary:     Effort: Pulmonary effort is normal.     Breath sounds: Wheezing (bilateral) present.  Abdominal:     Palpations: Abdomen is soft.     Tenderness: There is no abdominal tenderness.  Musculoskeletal:     Cervical back: Normal range of motion.     Right lower leg: No edema.     Left lower leg: No edema.  Skin:    General: Skin is warm and dry.  Neurological:     General: No focal deficit present.     Mental Status: He is alert and oriented to person, place, and time.  Psychiatric:        Mood and Affect: Mood normal.        Behavior: Behavior normal.        Thought Content: Thought content normal.        Judgment: Judgment normal.    BP 107/69   Pulse 88   Temp 97.9 F (36.6 C) (Oral)   Ht 5' 11.3" (1.811 m)   Wt 163 lb 12.8 oz (74.3 kg)   SpO2 96%   BMI 22.65 kg/m  Wt Readings from Last 3 Encounters:  12/12/20 163 lb 12.8 oz (74.3 kg)  05/13/20 180 lb (81.6 kg)  03/18/20 180 lb (81.6 kg)     Health Maintenance Due  Topic Date Due   COVID-19 Vaccine (4 - Booster for Pfizer series) 10/11/2020    There are no preventive care reminders to display for this patient.  Lab Results  Component Value Date   TSH 2.020 10/05/2019   Lab Results  Component Value Date   WBC 4.6 10/30/2016   HGB 17.4 10/30/2016   HCT 49.6 10/30/2016   MCV 104 (H) 10/30/2016   PLT 206 10/30/2016   Lab Results  Component Value Date   NA 132 (L) 10/05/2019   K 4.3 10/05/2019   CO2 18 (L) 10/05/2019   GLUCOSE 97 10/05/2019   BUN 9 10/05/2019   CREATININE 1.21 10/05/2019   BILITOT 1.3 (H) 08/25/2018   ALKPHOS 72 08/25/2018   AST 32 08/25/2018   ALT 30 08/25/2018   PROT 6.7 08/25/2018   ALBUMIN 4.8 08/25/2018   CALCIUM 10.2 10/05/2019    Lab Results  Component Value Date   CHOL 170 10/05/2019   Lab Results  Component Value Date   HDL 116 10/05/2019   Lab Results  Component Value Date   LDLCALC 42  10/05/2019   Lab Results  Component Value Date   TRIG 58 10/05/2019   No results found for: CHOLHDL No results found for: HGBA1C    Assessment & Plan:   Problem List Items Addressed This Visit       Cardiovascular and Mediastinum   Hypertension - Primary    Chronic, stable. Encouraged him to check his blood pressure at home. BP today 107/69. Will check CMP, CBC, and TSH today. Will consider stopping amlodipine if blood pressure is in the same range as today. Refills sent to the pharmacy. Follow-up in 6 months.       Relevant Medications   amLODipine (NORVASC) 5 MG tablet   Other Relevant Orders   Comp Met (CMET)   CBC with Differential   TSH   Atherosclerosis of aorta (HCC)    Chronic, stable. Noted on CT scan. Continue lipitor and aspirin. Check lipid panel today. Follow-up in 6 months.        Relevant Medications   amLODipine (NORVASC) 5 MG tablet   Other Relevant Orders   Lipid Panel w/o Chol/HDL Ratio     Respiratory   Centrilobular emphysema (HCC)    Noted on CT scan. States he has no shortness of breath or cough. Not on any inhalers at this time. With wheezing noted on exam, will check spirometry next visit.          Digestive   GERD (gastroesophageal reflux disease)    Chronic, stable. Continue current regimen. Will check magnesium since he is on PPI. Refills sent to pharmacy. Follow-up in 6 months.       Relevant Medications   omeprazole (PRILOSEC) 20 MG capsule   Other Relevant Orders   Magnesium     Genitourinary   Stage 3 chronic kidney disease (HCC)    Chronic, stable. Will check CMP and CBC today and treat based on results.          Other   Nicotine dependence, cigarettes, uncomplicated    Smokes almost 1 ppd. Discussed complete tobacco cessation. He is not interested  at this time.        Mixed hyperlipidemia    Chronic, stable. Atherosclerosis noted on CT scan. Continue lipitor and aspirin. Check lipid panel today. Follow-up in 6 months.        Relevant Medications   amLODipine (NORVASC) 5 MG tablet   Other Relevant Orders   Comp Met (CMET)   CBC with Differential   Lipid Panel w/o Chol/HDL Ratio   Other Visit Diagnoses     Need for pneumococcal vaccination       pneumonia vaccine given today   Relevant Orders   Pneumococcal polysaccharide vaccine 23-valent greater than or equal to 2yo subcutaneous/IM (Completed)   Bilateral impacted cerumen       Bilateral ears irrigated with good results.        Meds ordered this encounter  Medications   amLODipine (NORVASC) 5 MG tablet    Sig: Take 1 tablet (5 mg total) by mouth daily.    Dispense:  90 tablet    Refill:  1   omeprazole (PRILOSEC) 20 MG capsule    Sig: TAKE 1 CAPSULE BY MOUTH EVERY DAY    Dispense:  90 capsule    Refill:  1    Schedule office visit prior to further refills.     Follow-up: Return in about 6 months (around 06/13/2021) for htn, hld, emphysema---needs spirometry.    Charyl Dancer, NP

## 2020-12-12 NOTE — Assessment & Plan Note (Signed)
Chronic, stable. Continue current regimen. Will check magnesium since he is on PPI. Refills sent to pharmacy. Follow-up in 6 months.

## 2020-12-12 NOTE — Assessment & Plan Note (Signed)
Chronic, stable. Atherosclerosis noted on CT scan. Continue lipitor and aspirin. Check lipid panel today. Follow-up in 6 months.

## 2020-12-12 NOTE — Assessment & Plan Note (Signed)
Chronic, stable. Will check CMP and CBC today and treat based on results.

## 2020-12-12 NOTE — Assessment & Plan Note (Signed)
Noted on CT scan. States he has no shortness of breath or cough. Not on any inhalers at this time. With wheezing noted on exam, will check spirometry next visit.

## 2020-12-12 NOTE — Assessment & Plan Note (Signed)
Chronic, stable. Encouraged him to check his blood pressure at home. BP today 107/69. Will check CMP, CBC, and TSH today. Will consider stopping amlodipine if blood pressure is in the same range as today. Refills sent to the pharmacy. Follow-up in 6 months.

## 2020-12-12 NOTE — Assessment & Plan Note (Signed)
Chronic, stable. Noted on CT scan. Continue lipitor and aspirin. Check lipid panel today. Follow-up in 6 months.

## 2020-12-12 NOTE — Assessment & Plan Note (Signed)
Smokes almost 1 ppd. Discussed complete tobacco cessation. He is not interested at this time.

## 2020-12-13 LAB — COMPREHENSIVE METABOLIC PANEL
ALT: 26 IU/L (ref 0–44)
AST: 29 IU/L (ref 0–40)
Albumin/Globulin Ratio: 2.7 — ABNORMAL HIGH (ref 1.2–2.2)
Albumin: 4.9 g/dL — ABNORMAL HIGH (ref 3.8–4.8)
Alkaline Phosphatase: 82 IU/L (ref 44–121)
BUN/Creatinine Ratio: 10 (ref 10–24)
BUN: 11 mg/dL (ref 8–27)
Bilirubin Total: 1.5 mg/dL — ABNORMAL HIGH (ref 0.0–1.2)
CO2: 18 mmol/L — ABNORMAL LOW (ref 20–29)
Calcium: 10 mg/dL (ref 8.6–10.2)
Chloride: 99 mmol/L (ref 96–106)
Creatinine, Ser: 1.07 mg/dL (ref 0.76–1.27)
Globulin, Total: 1.8 g/dL (ref 1.5–4.5)
Glucose: 100 mg/dL — ABNORMAL HIGH (ref 65–99)
Potassium: 4 mmol/L (ref 3.5–5.2)
Sodium: 134 mmol/L (ref 134–144)
Total Protein: 6.7 g/dL (ref 6.0–8.5)
eGFR: 75 mL/min/{1.73_m2} (ref >59)

## 2020-12-13 LAB — CBC WITH DIFFERENTIAL/PLATELET
Basophils Absolute: 0.1 10*3/uL (ref 0.0–0.2)
Basos: 1 %
EOS (ABSOLUTE): 0.1 10*3/uL (ref 0.0–0.4)
Eos: 3 %
Hematocrit: 41 % (ref 37.5–51.0)
Hemoglobin: 14.7 g/dL (ref 13.0–17.7)
Immature Grans (Abs): 0 10*3/uL (ref 0.0–0.1)
Immature Granulocytes: 0 %
Lymphocytes Absolute: 1.4 10*3/uL (ref 0.7–3.1)
Lymphs: 33 %
MCH: 36.8 pg — ABNORMAL HIGH (ref 26.6–33.0)
MCHC: 35.9 g/dL — ABNORMAL HIGH (ref 31.5–35.7)
MCV: 103 fL — ABNORMAL HIGH (ref 79–97)
Monocytes Absolute: 0.4 10*3/uL (ref 0.1–0.9)
Monocytes: 10 %
Neutrophils Absolute: 2.3 10*3/uL (ref 1.4–7.0)
Neutrophils: 53 %
Platelets: 154 10*3/uL (ref 150–450)
RBC: 3.99 x10E6/uL — ABNORMAL LOW (ref 4.14–5.80)
RDW: 13.4 % (ref 11.6–15.4)
WBC: 4.3 10*3/uL (ref 3.4–10.8)

## 2020-12-13 LAB — LIPID PANEL W/O CHOL/HDL RATIO
Cholesterol, Total: 161 mg/dL (ref 100–199)
HDL: 106 mg/dL (ref >39)
LDL Chol Calc (NIH): 42 mg/dL (ref 0–99)
Triglycerides: 64 mg/dL (ref 0–149)
VLDL Cholesterol Cal: 13 mg/dL (ref 5–40)

## 2020-12-13 LAB — MAGNESIUM: Magnesium: 1.6 mg/dL (ref 1.6–2.3)

## 2020-12-13 LAB — TSH: TSH: 2.53 u[IU]/mL (ref 0.450–4.500)

## 2021-01-10 ENCOUNTER — Other Ambulatory Visit: Payer: Self-pay | Admitting: *Deleted

## 2021-01-10 DIAGNOSIS — Z8546 Personal history of malignant neoplasm of prostate: Secondary | ICD-10-CM

## 2021-03-15 ENCOUNTER — Other Ambulatory Visit: Payer: Medicare Other

## 2021-03-15 ENCOUNTER — Other Ambulatory Visit: Payer: Self-pay

## 2021-03-15 DIAGNOSIS — Z8546 Personal history of malignant neoplasm of prostate: Secondary | ICD-10-CM

## 2021-03-16 LAB — PSA: Prostate Specific Ag, Serum: <0.1 ng/mL (ref 0.0–4.0)

## 2021-03-17 ENCOUNTER — Encounter: Payer: Self-pay | Admitting: Urology

## 2021-03-17 ENCOUNTER — Ambulatory Visit (INDEPENDENT_AMBULATORY_CARE_PROVIDER_SITE_OTHER): Payer: Medicare Other | Admitting: Urology

## 2021-03-17 ENCOUNTER — Other Ambulatory Visit: Payer: Self-pay

## 2021-03-17 VITALS — BP 125/72 | HR 91 | Ht 71.3 in | Wt 160.0 lb

## 2021-03-17 DIAGNOSIS — Z8546 Personal history of malignant neoplasm of prostate: Secondary | ICD-10-CM

## 2021-03-17 NOTE — Progress Notes (Signed)
   03/17/2021 12:14 PM   [name] Feb 11, 1951 [id number]  Referring provider: Venita Lick, NP [address] [place],  [place] [address]  Chief Complaint  Patient presents with   Prostate Cancer    Urologic history: 1.  Prostate cancer - RALP 03/2012 at [facility]; focal positive margin left mid; undetectable PSA postop  HPI: 70 y.o. male who presents for annual follow-up.  Doing well since last visit No bothersome LUTS Denies dysuria, gross hematuria Denies flank, abdominal or pelvic pain PSA 03/15/2021 remains undetectable at <0.1   PMH: Past Medical History:  Diagnosis Date   Hypertension    Malignant neoplasm prostate ([place])    Polycythemia    Tobacco use     Surgical History: Past Surgical History:  Procedure Laterality Date   FOOT NEUROMA SURGERY Left    PROSTATECTOMY  03/2012    Home Medications:  Allergies as of 03/17/2021   No Known Allergies      Medication List        Accurate as of March 17, 2021 12:14 PM. If you have any questions, ask your nurse or doctor.          amLODipine 5 MG tablet Commonly known as: NORVASC Take 1 tablet (5 mg total) by mouth daily.   aspirin EC 81 MG tablet Take 81 mg by mouth daily.   atorvastatin 20 MG tablet Commonly known as: LIPITOR TAKE 1 TABLET BY MOUTH EVERY DAY   lisinopril 10 MG tablet Commonly known as: ZESTRIL TAKE 2 TABLETS BY MOUTH EVERY DAY   omeprazole 20 MG capsule Commonly known as: PRILOSEC TAKE 1 CAPSULE BY MOUTH EVERY DAY        Allergies: No Known Allergies  Family History: Family History  Problem Relation Age of Onset   Heart disease Mother        a-fib   Dementia Mother    Stomach cancer Father    Heart disease Brother    Heart disease Brother     Social History:  reports that he has been smoking cigarettes. He has a 46.00 pack-year smoking history. He has never used smokeless tobacco. He reports current alcohol use of about 14.0 standard drinks per  week. He reports that he does not use drugs.   Physical Exam: BP 125/72   Pulse 91   Ht 5' 11.3" (1.811 m)   Wt 160 lb (72.6 kg)   BMI 22.13 kg/m   Constitutional:  Alert and oriented, No acute distress. HEENT: [place] AT, moist mucus membranes.  Trachea midline, no masses. Cardiovascular: No clubbing, cyanosis, or edema. Respiratory: Normal respiratory effort, no increased work of breathing. Neurologic: Grossly intact, no focal deficits, moving all 4 extremities. Psychiatric: Normal mood and affect.   Assessment & Plan:    1.  Personal history of prostate cancer PSA remains undetectable Continue annual follow-up   Abbie Sons, MD  [facility] [address] [place], [place] [address] [phone]

## 2021-05-15 ENCOUNTER — Ambulatory Visit: Payer: Medicare Other

## 2021-06-13 ENCOUNTER — Encounter: Payer: Self-pay | Admitting: Nurse Practitioner

## 2021-06-13 ENCOUNTER — Other Ambulatory Visit: Payer: Self-pay

## 2021-06-13 ENCOUNTER — Ambulatory Visit (INDEPENDENT_AMBULATORY_CARE_PROVIDER_SITE_OTHER): Payer: Medicare Other | Admitting: Nurse Practitioner

## 2021-06-13 VITALS — BP 92/64 | HR 108 | Temp 97.8°F | Wt 166.8 lb

## 2021-06-13 DIAGNOSIS — N1831 Chronic kidney disease, stage 3a: Secondary | ICD-10-CM

## 2021-06-13 DIAGNOSIS — E782 Mixed hyperlipidemia: Secondary | ICD-10-CM

## 2021-06-13 DIAGNOSIS — E538 Deficiency of other specified B group vitamins: Secondary | ICD-10-CM

## 2021-06-13 DIAGNOSIS — D508 Other iron deficiency anemias: Secondary | ICD-10-CM | POA: Diagnosis not present

## 2021-06-13 DIAGNOSIS — I10 Essential (primary) hypertension: Secondary | ICD-10-CM | POA: Diagnosis not present

## 2021-06-13 DIAGNOSIS — I700 Atherosclerosis of aorta: Secondary | ICD-10-CM

## 2021-06-13 DIAGNOSIS — D751 Secondary polycythemia: Secondary | ICD-10-CM | POA: Diagnosis not present

## 2021-06-13 DIAGNOSIS — F17210 Nicotine dependence, cigarettes, uncomplicated: Secondary | ICD-10-CM | POA: Diagnosis not present

## 2021-06-13 DIAGNOSIS — J432 Centrilobular emphysema: Secondary | ICD-10-CM

## 2021-06-13 DIAGNOSIS — I493 Ventricular premature depolarization: Secondary | ICD-10-CM | POA: Diagnosis not present

## 2021-06-13 DIAGNOSIS — D7589 Other specified diseases of blood and blood-forming organs: Secondary | ICD-10-CM | POA: Diagnosis not present

## 2021-06-13 MED ORDER — METOPROLOL SUCCINATE ER 25 MG PO TB24: 25.0000 mg | ORAL_TABLET | Freq: Every day | ORAL | 4 refills | Status: DC

## 2021-06-13 MED ORDER — LISINOPRIL 10 MG PO TABS: 10.0000 mg | ORAL_TABLET | Freq: Every day | ORAL | 4 refills | Status: DC

## 2021-06-13 NOTE — Assessment & Plan Note (Signed)
Chronic, ongoing.  BP below goal today.   Will reduce Lisinopril to 10 MG daily, with goal to change to ARB in future due to his COPD underlying, + stop Amlodipine and change to Metoprolol XL 25 MG due to PVCs frequently.  BMP, TSH, and CBC today. Recommend he check BP at few days a week at home and document for provider visits.  Refills sent.  Return in 6 weeks.

## 2021-06-13 NOTE — Patient Instructions (Signed)
Stop taking Amlodipine daily and start Metoprolol XL 25 MG daily by mouth -- for heart rate control and blood pressure.  Decrease Lisinopril to 10 MG daily.  Monitor blood pressure at home, ensure it is less then 130/80, if elevations let me know.  Hypertension, Adult High blood pressure (hypertension) is when the force of blood pumping through the arteries is too strong. The arteries are the blood vessels that carry blood from the heart throughout the body. Hypertension forces the heart to work harder to pump blood and may cause arteries to become narrow or stiff. Untreated or uncontrolled hypertension can cause a heart attack, heart failure, a stroke, kidney disease, and other problems. A blood pressure reading consists of a higher number over a lower number. Ideally, your blood pressure should be below 120/80. The first ("top") number is called the systolic pressure. It is a measure of the pressure in your arteries as your heart beats. The second ("bottom") number is called the diastolic pressure. It is a measure of the pressure in your arteries as the heart relaxes. What are the causes? The exact cause of this condition is not known. There are some conditions that result in or are related to high blood pressure. What increases the risk? Some risk factors for high blood pressure are under your control. The following factors may make you more likely to develop this condition: Smoking. Having type 2 diabetes mellitus, high cholesterol, or both. Not getting enough exercise or physical activity. Being overweight. Having too much fat, sugar, calories, or salt (sodium) in your diet. Drinking too much alcohol. Some risk factors for high blood pressure may be difficult or impossible to change. Some of these factors include: Having chronic kidney disease. Having a family history of high blood pressure. Age. Risk increases with age. Race. You may be at higher risk if you are African American. Gender.  Men are at higher risk than women before age 86. After age 36, women are at higher risk than men. Having obstructive sleep apnea. Stress. What are the signs or symptoms? High blood pressure may not cause symptoms. Very high blood pressure (hypertensive crisis) may cause: Headache. Anxiety. Shortness of breath. Nosebleed. Nausea and vomiting. Vision changes. Severe chest pain. Seizures. How is this diagnosed? This condition is diagnosed by measuring your blood pressure while you are seated, with your arm resting on a flat surface, your legs uncrossed, and your feet flat on the floor. The cuff of the blood pressure monitor will be placed directly against the skin of your upper arm at the level of your heart. It should be measured at least twice using the same arm. Certain conditions can cause a difference in blood pressure between your right and left arms. Certain factors can cause blood pressure readings to be lower or higher than normal for a short period of time: When your blood pressure is higher when you are in a health care provider's office than when you are at home, this is called white coat hypertension. Most people with this condition do not need medicines. When your blood pressure is higher at home than when you are in a health care provider's office, this is called masked hypertension. Most people with this condition may need medicines to control blood pressure. If you have a high blood pressure reading during one visit or you have normal blood pressure with other risk factors, you may be asked to: Return on a different day to have your blood pressure checked again. Monitor your  blood pressure at home for 1 week or longer. If you are diagnosed with hypertension, you may have other blood or imaging tests to help your health care provider understand your overall risk for other conditions. How is this treated? This condition is treated by making healthy lifestyle changes, such as eating  healthy foods, exercising more, and reducing your alcohol intake. Your health care provider may prescribe medicine if lifestyle changes are not enough to get your blood pressure under control, and if: Your systolic blood pressure is above 130. Your diastolic blood pressure is above 80. Your personal target blood pressure may vary depending on your medical conditions, your age, and other factors. Follow these instructions at home: Eating and drinking  Eat a diet that is high in fiber and potassium, and low in sodium, added sugar, and fat. An example eating plan is called the DASH (Dietary Approaches to Stop Hypertension) diet. To eat this way: Eat plenty of fresh fruits and vegetables. Try to fill one half of your plate at each meal with fruits and vegetables. Eat whole grains, such as whole-wheat pasta, brown rice, or whole-grain bread. Fill about one fourth of your plate with whole grains. Eat or drink low-fat dairy products, such as skim milk or low-fat yogurt. Avoid fatty cuts of meat, processed or cured meats, and poultry with skin. Fill about one fourth of your plate with lean proteins, such as fish, chicken without skin, beans, eggs, or tofu. Avoid pre-made and processed foods. These tend to be higher in sodium, added sugar, and fat. Reduce your daily sodium intake. Most people with hypertension should eat less than 1,500 mg of sodium a day. Do not drink alcohol if: Your health care provider tells you not to drink. You are pregnant, may be pregnant, or are planning to become pregnant. If you drink alcohol: Limit how much you use to: 0-1 drink a day for women. 0-2 drinks a day for men. Be aware of how much alcohol is in your drink. In the [place] one drink equals one 12 oz bottle of beer (355 mL), one 5 oz glass of wine (148 mL), or one 1 oz glass of hard liquor (44 mL). Lifestyle  Work with your health care provider to maintain a healthy body weight or to lose weight. Ask what an ideal  weight is for you. Get at least 30 minutes of exercise most days of the week. Activities may include walking, swimming, or biking. Include exercise to strengthen your muscles (resistance exercise), such as Pilates or lifting weights, as part of your weekly exercise routine. Try to do these types of exercises for 30 minutes at least 3 days a week. Do not use any products that contain nicotine or tobacco, such as cigarettes, e-cigarettes, and chewing tobacco. If you need help quitting, ask your health care provider. Monitor your blood pressure at home as told by your health care provider. Keep all follow-up visits as told by your health care provider. This is important. Medicines Take over-the-counter and prescription medicines only as told by your health care provider. Follow directions carefully. Blood pressure medicines must be taken as prescribed. Do not skip doses of blood pressure medicine. Doing this puts you at risk for problems and can make the medicine less effective. Ask your health care provider about side effects or reactions to medicines that you should watch for. Contact a health care provider if you: Think you are having a reaction to a medicine you are taking. Have headaches that keep  coming back (recurring). Feel dizzy. Have swelling in your ankles. Have trouble with your vision. Get help right away if you: Develop a severe headache or confusion. Have unusual weakness or numbness. Feel faint. Have severe pain in your chest or abdomen. Vomit repeatedly. Have trouble breathing. Summary Hypertension is when the force of blood pumping through your arteries is too strong. If this condition is not controlled, it may put you at risk for serious complications. Your personal target blood pressure may vary depending on your medical conditions, your age, and other factors. For most people, a normal blood pressure is less than 120/80. Hypertension is treated with lifestyle changes,  medicines, or a combination of both. Lifestyle changes include losing weight, eating a healthy, low-sodium diet, exercising more, and limiting alcohol. This information is not intended to replace advice given to you by your health care provider. Make sure you discuss any questions you have with your health care provider. Document Revised: 02/19/2018 Document Reviewed: 02/19/2018 Elsevier Patient Education  [place].

## 2021-06-13 NOTE — Assessment & Plan Note (Signed)
Recent labs continue to show improvement -- recheck BMP today.

## 2021-06-13 NOTE — Assessment & Plan Note (Addendum)
Noted on EKG today -- he prefers not to attend cardiology at this time.  Will stop Amlodipine and start Metoprolol XL (due to COPD, preference for this BB) 25 MG daily to reduce HR and PVC burden.  He is asymptomatic at this time.  Consider cardiology visit in future if patient agreeable.

## 2021-06-13 NOTE — Assessment & Plan Note (Signed)
Noted on lung CT screening.  Recommend complete cessation of smoking.  Continue daily statin and ASA for prevention.

## 2021-06-13 NOTE — Progress Notes (Signed)
BP 92/64    Pulse (!) 108    Temp 97.8 F (36.6 C)    Wt 166 lb 12.8 oz (75.7 kg)    SpO2 96%    BMI 23.07 kg/m    Subjective:    Patient ID: Billy Duncan, male    DOB: 01-Apr-1951, 70 y.o.   MRN: [id number]  HPI: Billy Duncan is a 70 y.o. male  Chief Complaint  Patient presents with   Hyperlipidemia   Hypertension   Emphysema   HYPERTENSION / HYPERLIPIDEMIA Currently takes Lisinopril 20 MG and Amlodipine 5 MG for HTN. Lipitor for HLD. Satisfied with current treatment? yes Duration of hypertension: chronic BP monitoring frequency: not checking BP range:  BP medication side effects: no Duration of hyperlipidemia: chronic Cholesterol medication side effects: no Cholesterol supplements: none Medication compliance: good compliance Aspirin: yes Recent stressors: no Recurrent headaches: no Visual changes: no Palpitations: no Dyspnea: no Chest pain: no Lower extremity edema: no Dizzy/lightheaded: no The ASCVD Risk score (Arnett DK, et al., 2019) failed to calculate for the following reasons:   The valid HDL cholesterol range is 20 to 100 mg/dL  COPD Noted on lung screening -- centrilobular and paraseptal  Emphysema + aortic atherosclerosis.  Continues to smoke, smokes < 1 PPD, not interested in quitting at this time.  Has history of macrocytosis and polycythemia on past labs + folic acid levels being low == history of low B12 and iron too.  No current supplements.   COPD status: stable Satisfied with current treatment?: yes Oxygen use: no Dyspnea frequency:  Cough frequency:  Rescue inhaler frequency:   Limitation of activity: no Productive cough:  Last Spirometry:  Pneumovax: Up to Date Influenza: Up to Date  Depression screen [facility] 2/9 06/13/2021 05/13/2020 05/13/2019 02/20/2018 10/18/2017  Decreased Interest 0 0 0 0 0  Down, Depressed, Hopeless 0 0 0 0 0  PHQ - 2 Score 0 0 0 0 0  Altered sleeping 0 - - 0 -  Tired, decreased energy 0 - - 0 -  Change in  appetite 0 - - 0 -  Feeling bad or failure about yourself  0 - - 0 -  Trouble concentrating 0 - - 0 -  Moving slowly or fidgety/restless 0 - - 0 -  Suicidal thoughts 0 - - 0 -  PHQ-9 Score 0 - - 0 -  Difficult doing work/chores Not difficult at all - - Not difficult at all -     Relevant past medical, surgical, family and social history reviewed and updated as indicated. Interim medical history since our last visit reviewed. Allergies and medications reviewed and updated.  Review of Systems  Constitutional:  Negative for activity change, diaphoresis, fatigue and fever.  Respiratory:  Negative for cough, chest tightness, shortness of breath and wheezing.   Cardiovascular:  Negative for chest pain, palpitations and leg swelling.  Gastrointestinal: Negative.   Psychiatric/Behavioral: Negative.     Per HPI unless specifically indicated above     Objective:    BP 92/64    Pulse (!) 108    Temp 97.8 F (36.6 C)    Wt 166 lb 12.8 oz (75.7 kg)    SpO2 96%    BMI 23.07 kg/m   Wt Readings from Last 3 Encounters:  06/13/21 166 lb 12.8 oz (75.7 kg)  03/17/21 160 lb (72.6 kg)  12/12/20 163 lb 12.8 oz (74.3 kg)    Physical Exam Vitals and nursing note reviewed.  Constitutional:  General: He is awake. He is not in acute distress.    Appearance: He is well-developed and well-groomed. He is not ill-appearing or toxic-appearing.  HENT:     Head: Normocephalic and atraumatic.     Right Ear: Hearing normal. No drainage.     Left Ear: Hearing normal. No drainage.  Eyes:     General: Lids are normal.        Right eye: No discharge.        Left eye: No discharge.     Conjunctiva/sclera: Conjunctivae normal.     Pupils: Pupils are equal, round, and reactive to light.  Neck:     Vascular: No carotid bruit.  Cardiovascular:     Rate and Rhythm: Tachycardia present. Rhythm irregularly irregular.     Heart sounds: Normal heart sounds, S1 normal and S2 normal. No murmur heard.   No gallop.   Pulmonary:     Effort: Pulmonary effort is normal. No accessory muscle usage or respiratory distress.     Breath sounds: Normal breath sounds.  Abdominal:     General: Bowel sounds are normal.     Palpations: Abdomen is soft.  Musculoskeletal:        General: Normal range of motion.     Cervical back: Normal range of motion and neck supple.     Right lower leg: No edema.     Left lower leg: No edema.  Skin:    General: Skin is warm and dry.     Capillary Refill: Capillary refill takes less than 2 seconds.  Neurological:     Mental Status: He is alert and oriented to person, place, and time.  Psychiatric:        Attention and Perception: Attention normal.        Mood and Affect: Mood normal.        Speech: Speech normal.        Behavior: Behavior normal. Behavior is cooperative.        Thought Content: Thought content normal.   EKG My review and personal interpretation at Time: 1030 Indication: irregular heart beat  Rate: 98 Rhythm: sinus Axis: left axis shift Other: No nonspecific st abn, no stemi, no lvh == frequent PVC's noted  Results for orders placed or performed in visit on 03/15/21  PSA  Result Value Ref Range   Prostate Specific Ag, Serum <0.1 0.0 - 4.0 ng/mL      Assessment & Plan:   Problem List Items Addressed This Visit       Cardiovascular and Mediastinum   Atherosclerosis of aorta ([place])    Noted on lung CT screening.  Recommend complete cessation of smoking.  Continue daily statin and ASA for prevention.      Relevant Medications   lisinopril (ZESTRIL) 10 MG tablet   metoprolol succinate (TOPROL-XL) 25 MG 24 hr tablet   Frequent PVCs    Noted on EKG today -- he prefers not to attend cardiology at this time.  Will stop Amlodipine and start Metoprolol XL (due to COPD, preference for this BB) 25 MG daily to reduce HR and PVC burden.  He is asymptomatic at this time.  Consider cardiology visit in future if patient agreeable.      Relevant Medications    lisinopril (ZESTRIL) 10 MG tablet   metoprolol succinate (TOPROL-XL) 25 MG 24 hr tablet   Other Relevant Orders   EKG 12-Lead (Completed)   Hypertension    Chronic, ongoing.  BP below goal today.  Will reduce Lisinopril to 10 MG daily, with goal to change to ARB in future due to his COPD underlying, + stop Amlodipine and change to Metoprolol XL 25 MG due to PVCs frequently.  BMP, TSH, and CBC today. Recommend he check BP at few days a week at home and document for provider visits.  Refills sent.  Return in 6 weeks.      Relevant Medications   lisinopril (ZESTRIL) 10 MG tablet   metoprolol succinate (TOPROL-XL) 25 MG 24 hr tablet   Other Relevant Orders   Basic metabolic panel   TSH     Respiratory   Centrilobular emphysema ([place]) - Primary    Noted on CT scan screening. States he has no shortness of breath or cough. Not on any inhalers at this time. Plan on spirometry next visit and continue yearly screening.  Recommend complete cessation of smoking.        Genitourinary   Stage 3 chronic kidney disease ([place])    Recent labs continue to show improvement -- recheck BMP today.      Relevant Orders   Basic metabolic panel     Other   Folic acid deficiency    History of on review past notes, check folate/ferritin/CBC.      Relevant Orders   Folate   Ferritin   Iron and TIBC   Macrocytosis    History of on review past notes, check folate/ferritin/CBC/iron/B12.      Relevant Orders   CBC with Differential/Platelet   Folate   Ferritin   Iron and TIBC   Vitamin B12   Mixed hyperlipidemia    Chronic, ongoing.  Continue current medication regimen and adjust as needed.  Lipid panel today.      Relevant Medications   lisinopril (ZESTRIL) 10 MG tablet   metoprolol succinate (TOPROL-XL) 25 MG 24 hr tablet   Other Relevant Orders   Lipid Panel w/o Chol/HDL Ratio   Nicotine dependence, cigarettes, uncomplicated    I have recommended complete cessation of tobacco use. I have  discussed various options available for assistance with tobacco cessation including over the counter methods (Nicotine gum, patch and lozenges). We also discussed prescription options (Chantix, Nicotine Inhaler / Nasal Spray). The patient is not interested in pursuing any prescription tobacco cessation options at this time.       Polycythemia    Recheck CBC today.      Relevant Orders   CBC with Differential/Platelet   Folate   Ferritin   Iron and TIBC   Vitamin B12   Other Visit Diagnoses     B12 deficiency       History of low levels reported, check on labs today.   Relevant Orders   Vitamin B12   Other iron deficiency anemia       History of iron deficiency, check on labs today.   Relevant Orders   Ferritin   Iron and TIBC        Follow up plan: Return in about 6 weeks (around 07/25/2021) for [place] and COPD with spirometry.

## 2021-06-13 NOTE — Assessment & Plan Note (Signed)
Noted on CT scan screening. States he has no shortness of breath or cough. Not on any inhalers at this time. Plan on spirometry next visit and continue yearly screening.  Recommend complete cessation of smoking.

## 2021-06-13 NOTE — Assessment & Plan Note (Signed)
History of on review past notes, check folate/ferritin/CBC.

## 2021-06-13 NOTE — Assessment & Plan Note (Signed)
History of on review past notes, check folate/ferritin/CBC/iron/B12.

## 2021-06-13 NOTE — Assessment & Plan Note (Signed)
Recheck CBC today. 

## 2021-06-13 NOTE — Assessment & Plan Note (Signed)
I have recommended complete cessation of tobacco use. I have discussed various options available for assistance with tobacco cessation including over the counter methods (Nicotine gum, patch and lozenges). We also discussed prescription options (Chantix, Nicotine Inhaler / Nasal Spray). The patient is not interested in pursuing any prescription tobacco cessation options at this time.  

## 2021-06-13 NOTE — Assessment & Plan Note (Signed)
Chronic, ongoing.  Continue current medication regimen and adjust as needed. Lipid panel today. 

## 2021-06-14 ENCOUNTER — Other Ambulatory Visit: Payer: Self-pay | Admitting: Nurse Practitioner

## 2021-06-14 LAB — VITAMIN B12: Vitamin B-12: 113 pg/mL — ABNORMAL LOW (ref 232–1245)

## 2021-06-14 LAB — CBC WITH DIFFERENTIAL/PLATELET
Basophils Absolute: 0.1 10*3/uL (ref 0.0–0.2)
Basos: 2 %
EOS (ABSOLUTE): 0.1 10*3/uL (ref 0.0–0.4)
Eos: 3 %
Hematocrit: 36.3 % — ABNORMAL LOW (ref 37.5–51.0)
Hemoglobin: 13.4 g/dL (ref 13.0–17.7)
Immature Grans (Abs): 0 10*3/uL (ref 0.0–0.1)
Immature Granulocytes: 0 %
Lymphocytes Absolute: 1.6 10*3/uL (ref 0.7–3.1)
Lymphs: 35 %
MCH: 37.2 pg — ABNORMAL HIGH (ref 26.6–33.0)
MCHC: 36.9 g/dL — ABNORMAL HIGH (ref 31.5–35.7)
MCV: 101 fL — ABNORMAL HIGH (ref 79–97)
Monocytes Absolute: 0.4 10*3/uL (ref 0.1–0.9)
Monocytes: 9 %
Neutrophils Absolute: 2.4 10*3/uL (ref 1.4–7.0)
Neutrophils: 51 %
Platelets: 199 10*3/uL (ref 150–450)
RBC: 3.6 x10E6/uL — ABNORMAL LOW (ref 4.14–5.80)
RDW: 12.3 % (ref 11.6–15.4)
WBC: 4.6 10*3/uL (ref 3.4–10.8)

## 2021-06-14 LAB — BASIC METABOLIC PANEL
BUN/Creatinine Ratio: 8 — ABNORMAL LOW (ref 10–24)
BUN: 9 mg/dL (ref 8–27)
CO2: 19 mmol/L — ABNORMAL LOW (ref 20–29)
Calcium: 9.7 mg/dL (ref 8.6–10.2)
Chloride: 96 mmol/L (ref 96–106)
Creatinine, Ser: 1.17 mg/dL (ref 0.76–1.27)
Glucose: 101 mg/dL — ABNORMAL HIGH (ref 70–99)
Potassium: 4.2 mmol/L (ref 3.5–5.2)
Sodium: 134 mmol/L (ref 134–144)
eGFR: 67 mL/min/{1.73_m2} (ref >59)

## 2021-06-14 LAB — TSH: TSH: 3.02 u[IU]/mL (ref 0.450–4.500)

## 2021-06-14 LAB — FOLATE: Folate: <2 ng/mL — ABNORMAL LOW (ref >3.0)

## 2021-06-14 LAB — LIPID PANEL W/O CHOL/HDL RATIO
Cholesterol, Total: 170 mg/dL (ref 100–199)
HDL: 121 mg/dL (ref >39)
LDL Chol Calc (NIH): 35 mg/dL (ref 0–99)
Triglycerides: 79 mg/dL (ref 0–149)
VLDL Cholesterol Cal: 14 mg/dL (ref 5–40)

## 2021-06-14 LAB — IRON AND TIBC
Iron Saturation: 71 % — ABNORMAL HIGH (ref 15–55)
Iron: 211 ug/dL — ABNORMAL HIGH (ref 38–169)
Total Iron Binding Capacity: 299 ug/dL (ref 250–450)
UIBC: 88 ug/dL — ABNORMAL LOW (ref 111–343)

## 2021-06-14 LAB — FERRITIN: Ferritin: 425 ng/mL — ABNORMAL HIGH (ref 30–400)

## 2021-06-14 MED ORDER — FOLIC ACID 1 MG PO TABS: 1.0000 mg | ORAL_TABLET | Freq: Every day | ORAL | 4 refills | Status: DC

## 2021-06-14 MED ORDER — VITAMIN B-12 1000 MCG PO TABS: 1000.0000 ug | ORAL_TABLET | Freq: Every day | ORAL | 4 refills | Status: DC

## 2021-06-14 NOTE — Progress Notes (Signed)
Good morning, please let Jansel know his labs have returned: - CBC remains stable, but Vitamin B12 level and folate quite low -- I am going to send in supplements I need you to start taking and we will recheck levels next visit.   - Thyroid level is normal. - Iron level is stable, a little high in fact, if taking iron tablets at home cut back on these. - Cholesterol levels remain stable, continue Atorvastatin. - Kidney function is normal.  Any questions? Keep being awesome!!  Thank you for allowing me to participate in your care.  I appreciate you. Kindest regards, Nakhi Choi

## 2021-06-21 ENCOUNTER — Other Ambulatory Visit: Payer: Self-pay | Admitting: *Deleted

## 2021-06-21 DIAGNOSIS — F17210 Nicotine dependence, cigarettes, uncomplicated: Secondary | ICD-10-CM

## 2021-06-21 DIAGNOSIS — Z87891 Personal history of nicotine dependence: Secondary | ICD-10-CM

## 2021-06-30 ENCOUNTER — Ambulatory Visit
Admission: RE | Admit: 2021-06-30 | Discharge: 2021-06-30 | Disposition: A | Discharge status: Home / Self Care | Payer: Medicare Other | Source: Ambulatory Visit | Attending: Acute Care | Admitting: Acute Care

## 2021-06-30 ENCOUNTER — Other Ambulatory Visit: Payer: Self-pay

## 2021-06-30 DIAGNOSIS — F17210 Nicotine dependence, cigarettes, uncomplicated: Secondary | ICD-10-CM | POA: Diagnosis not present

## 2021-06-30 DIAGNOSIS — Z87891 Personal history of nicotine dependence: Secondary | ICD-10-CM | POA: Insufficient documentation

## 2021-07-03 ENCOUNTER — Other Ambulatory Visit: Payer: Self-pay | Admitting: Acute Care

## 2021-07-03 DIAGNOSIS — F17210 Nicotine dependence, cigarettes, uncomplicated: Secondary | ICD-10-CM

## 2021-07-03 DIAGNOSIS — Z87891 Personal history of nicotine dependence: Secondary | ICD-10-CM

## 2021-07-20 ENCOUNTER — Other Ambulatory Visit: Payer: Self-pay | Admitting: Nurse Practitioner

## 2021-07-22 DIAGNOSIS — E538 Deficiency of other specified B group vitamins: Secondary | ICD-10-CM | POA: Insufficient documentation

## 2021-07-22 NOTE — Patient Instructions (Signed)
Palpitations Palpitations are feelings that your heartbeat is not normal. Your heartbeat may feel like it is: Uneven (irregular). Faster than normal. Fluttering. Skipping a beat. This is usually not a serious problem. However, a doctor will do tests and check your medical history to make sure that you do not have a serious heart problem. Follow these instructions at home: Watch for any changes in your condition. Tell your doctor about any changes. Take these actions to help manage your symptoms: Eating and drinking Follow instructions from your doctor about things to eat and drink. You may be told to avoid these things: Drinks that have caffeine in them, such as coffee, tea, soft drinks, and energy drinks. Chocolate. Alcohol. Diet pills. Lifestyle   Try to lower your stress. These things can help you relax: Yoga. Deep breathing and meditation. Guided imagery. This is using words and images to create positive thoughts. Exercise, including swimming, jogging, and walking. Tell your doctor if you have more abnormal heartbeats when you are active. If you have chest pain or feel short of breath with exercise, do not keep doing the exercise until you are seen by your doctor. Biofeedback. This is using your mind to control things in your body, such as your heartbeat. Get plenty of rest and sleep. Keep a regular bed time. Do not use drugs, such as cocaine or ecstasy. Do not use marijuana. Do not smoke or use any products that contain nicotine or tobacco. If you need help quitting, ask your doctor. General instructions Take over-the-counter and prescription medicines only as told by your doctor. Keep all follow-up visits. You may need more tests if palpitations do not go away or get worse. Contact a doctor if: You keep having fast or uneven heartbeats for a long time. Your symptoms happen more often. Get help right away if: You have chest pain. You feel short of breath. You have a very bad  headache. You feel dizzy. You faint. These symptoms may be an emergency. Get help right away. Call your local emergency services (911 in the [place] Do not wait to see if the symptoms will go away. Do not drive yourself to the hospital. Summary Palpitations are feelings that your heartbeat is uneven or faster than normal. It may feel like your heart is fluttering or skipping a beat. Avoid food and drinks that may cause this condition. These include caffeine, chocolate, and alcohol. Try to lower your stress. Do not smoke or use drugs. Get help right away if you faint, feel dizzy, feel short of breath, have chest pain, or have a very bad headache. This information is not intended to replace advice given to you by your health care provider. Make sure you discuss any questions you have with your health care provider. Document Revised: 11/02/2020 Document Reviewed: 11/02/2020 Elsevier Patient Education  [place].

## 2021-07-24 ENCOUNTER — Ambulatory Visit (INDEPENDENT_AMBULATORY_CARE_PROVIDER_SITE_OTHER): Payer: Medicare Other | Admitting: *Deleted

## 2021-07-24 DIAGNOSIS — Z0000 Encounter for general adult medical examination without abnormal findings: Secondary | ICD-10-CM

## 2021-07-24 NOTE — Patient Instructions (Signed)
Mr. Billy Duncan , Thank you for taking time to come for your Medicare Wellness Visit. I appreciate your ongoing commitment to your health goals. Please review the following plan we discussed and let me know if I can assist you in the future.   Screening recommendations/referrals: Colonoscopy: up to date Recommended yearly ophthalmology/optometry visit for glaucoma screening and checkup Recommended yearly dental visit for hygiene and checkup  Vaccinations: Influenza vaccine: Education provided Pneumococcal vaccine: up to date Tdap vaccine: Education provided Shingles vaccine: Education provided    Advanced directives: Education provided  Conditions/risks identified:   Next appointment: 07-25-2021 @ 1:20  [place]  Preventive Care 70 Years and Older, Male Preventive care refers to lifestyle choices and visits with your health care provider that can promote health and wellness. What does preventive care include? A yearly physical exam. This is also called an annual well check. Dental exams once or twice a year. Routine eye exams. Ask your health care provider how often you should have your eyes checked. Personal lifestyle choices, including: Daily care of your teeth and gums. Regular physical activity. Eating a healthy diet. Avoiding tobacco and drug use. Limiting alcohol use. Practicing safe sex. Taking low doses of aspirin every day. Taking vitamin and mineral supplements as recommended by your health care provider. What happens during an annual well check? The services and screenings done by your health care provider during your annual well check will depend on your age, overall health, lifestyle risk factors, and family history of disease. Counseling  Your health care provider may ask you questions about your: Alcohol use. Tobacco use. Drug use. Emotional well-being. Home and relationship well-being. Sexual activity. Eating habits. History of falls. Memory and ability to  understand (cognition). Work and work Statistician. Screening  You may have the following tests or measurements: Height, weight, and BMI. Blood pressure. Lipid and cholesterol levels. These may be checked every 5 years, or more frequently if you are over 70 years old. Skin check. Lung cancer screening. You may have this screening every year starting at age 70 if you have a 30-pack-year history of smoking and currently smoke or have quit within the past 15 years. Fecal occult blood test (FOBT) of the stool. You may have this test every year starting at age 70. Flexible sigmoidoscopy or colonoscopy. You may have a sigmoidoscopy every 5 years or a colonoscopy every 10 years starting at age 70. Prostate cancer screening. Recommendations will vary depending on your family history and other risks. Hepatitis C blood test. Hepatitis B blood test. Sexually transmitted disease (STD) testing. Diabetes screening. This is done by checking your blood sugar (glucose) after you have not eaten for a while (fasting). You may have this done every 1-3 years. Abdominal aortic aneurysm (AAA) screening. You may need this if you are a current or former smoker. Osteoporosis. You may be screened starting at age 70 if you are at high risk. Talk with your health care provider about your test results, treatment options, and if necessary, the need for more tests. Vaccines  Your health care provider may recommend certain vaccines, such as: Influenza vaccine. This is recommended every year. Tetanus, diphtheria, and acellular pertussis (Tdap, Td) vaccine. You may need a Td booster every 10 years. Zoster vaccine. You may need this after age 70. Pneumococcal 13-valent conjugate (PCV13) vaccine. One dose is recommended after age 70. Pneumococcal polysaccharide (PPSV23) vaccine. One dose is recommended after age 70. Talk to your health care provider about which screenings and vaccines you  need and how often you need them. This  information is not intended to replace advice given to you by your health care provider. Make sure you discuss any questions you have with your health care provider. Document Released: 07/08/2015 Document Revised: 02/29/2016 Document Reviewed: 04/12/2015 Elsevier Interactive Patient Education  2017 [place] Prevention in the Home Falls can cause injuries. They can happen to people of all ages. There are many things you can do to make your home safe and to help prevent falls. What can I do on the outside of my home? Regularly fix the edges of walkways and driveways and fix any cracks. Remove anything that might make you trip as you walk through a door, such as a raised step or threshold. Trim any bushes or trees on the path to your home. Use bright outdoor lighting. Clear any walking paths of anything that might make someone trip, such as rocks or tools. Regularly check to see if handrails are loose or broken. Make sure that both sides of any steps have handrails. Any raised decks and porches should have guardrails on the edges. Have any leaves, snow, or ice cleared regularly. Use sand or salt on walking paths during winter. Clean up any spills in your garage right away. This includes oil or grease spills. What can I do in the bathroom? Use night lights. Install grab bars by the toilet and in the tub and shower. Do not use towel bars as grab bars. Use non-skid mats or decals in the tub or shower. If you need to sit down in the shower, use a plastic, non-slip stool. Keep the floor dry. Clean up any water that spills on the floor as soon as it happens. Remove soap buildup in the tub or shower regularly. Attach bath mats securely with double-sided non-slip rug tape. Do not have throw rugs and other things on the floor that can make you trip. What can I do in the bedroom? Use night lights. Make sure that you have a light by your bed that is easy to reach. Do not use any sheets or  blankets that are too big for your bed. They should not hang down onto the floor. Have a firm chair that has side arms. You can use this for support while you get dressed. Do not have throw rugs and other things on the floor that can make you trip. What can I do in the kitchen? Clean up any spills right away. Avoid walking on wet floors. Keep items that you use a lot in easy-to-reach places. If you need to reach something above you, use a strong step stool that has a grab bar. Keep electrical cords out of the way. Do not use floor polish or wax that makes floors slippery. If you must use wax, use non-skid floor wax. Do not have throw rugs and other things on the floor that can make you trip. What can I do with my stairs? Do not leave any items on the stairs. Make sure that there are handrails on both sides of the stairs and use them. Fix handrails that are broken or loose. Make sure that handrails are as long as the stairways. Check any carpeting to make sure that it is firmly attached to the stairs. Fix any carpet that is loose or worn. Avoid having throw rugs at the top or bottom of the stairs. If you do have throw rugs, attach them to the floor with carpet tape. Make sure that  you have a light switch at the top of the stairs and the bottom of the stairs. If you do not have them, ask someone to add them for you. What else can I do to help prevent falls? Wear shoes that: Do not have high heels. Have rubber bottoms. Are comfortable and fit you well. Are closed at the toe. Do not wear sandals. If you use a stepladder: Make sure that it is fully opened. Do not climb a closed stepladder. Make sure that both sides of the stepladder are locked into place. Ask someone to hold it for you, if possible. Clearly mark and make sure that you can see: Any grab bars or handrails. First and last steps. Where the edge of each step is. Use tools that help you move around (mobility aids) if they are  needed. These include: Canes. Walkers. Scooters. Crutches. Turn on the lights when you go into a dark area. Replace any light bulbs as soon as they burn out. Set up your furniture so you have a clear path. Avoid moving your furniture around. If any of your floors are uneven, fix them. If there are any pets around you, be aware of where they are. Review your medicines with your doctor. Some medicines can make you feel dizzy. This can increase your chance of falling. Ask your doctor what other things that you can do to help prevent falls. This information is not intended to replace advice given to you by your health care provider. Make sure you discuss any questions you have with your health care provider. Document Released: 04/07/2009 Document Revised: 11/17/2015 Document Reviewed: 07/16/2014 Elsevier Interactive Patient Education  2017 [employer].

## 2021-07-24 NOTE — Progress Notes (Signed)
Subjective:   Billy Duncan is a 71 y.o. male who presents for Medicare Annual/Subsequent preventive examination.   I connected with  Billy Duncan on 07/24/21 by a telephone enabled telemedicine application and verified that I am speaking with the correct person using two identifiers.   I discussed the limitations of evaluation and management by telemedicine. The patient expressed understanding and agreed to proceed.  Patient location: home  Provider location: Tele-Health not in office    Review of Systems     Cardiac Risk Factors include: advanced age (>48men, >35 women);smoking/ tobacco exposure;hypertension;male gender     Objective:    Today's Vitals   There is no height or weight on file to calculate BMI.  Advanced Directives 05/13/2020 05/13/2019 02/20/2018  Does Patient Have a Medical Advance Directive? No No No  Would patient like information on creating a medical advance directive? - - No - Patient declined    Current Medications (verified) Outpatient Encounter Medications as of 07/24/2021  Medication Sig   aspirin EC 81 MG tablet Take 81 mg by mouth daily.   atorvastatin (LIPITOR) 20 MG tablet TAKE 1 TABLET BY MOUTH EVERY DAY   folic acid (FOLVITE) 1 MG tablet Take 1 tablet (1 mg total) by mouth daily.   lisinopril (ZESTRIL) 10 MG tablet Take 1 tablet (10 mg total) by mouth daily.   metoprolol succinate (TOPROL-XL) 25 MG 24 hr tablet Take 1 tablet (25 mg total) by mouth daily.   omeprazole (PRILOSEC) 20 MG capsule TAKE 1 CAPSULE BY MOUTH EVERY DAY   vitamin B-12 (CYANOCOBALAMIN) 1000 MCG tablet Take 1 tablet (1,000 mcg total) by mouth daily.   No facility-administered encounter medications on file as of 07/24/2021.    Allergies (verified) Patient has no known allergies.   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[place])    Polycythemia    Tobacco use    Past Surgical History:  Procedure  Laterality Date   FOOT NEUROMA SURGERY Left    PROSTATECTOMY  03/2012   Family History  Problem Relation Age of Onset   Heart disease Mother        a-fib   Dementia Mother    Stomach cancer Father    Heart disease Brother    Heart disease Brother    Social History   Socioeconomic History   Marital status: Divorced    Spouse name: Not on file   Number of children: 2   Years of education: Not on file   Highest education level: Some college, no degree  Occupational History   Not on file  Tobacco Use   Smoking status: Every Day    Packs/day: 1.00    Years: 46.00    Pack years: 46.00    Types: Cigarettes   Smokeless tobacco: Never  Vaping Use   Vaping Use: Never used  Substance and Sexual Activity   Alcohol use: Yes    Alcohol/week: 14.0 standard drinks    Types: 14 Glasses of wine per week   Drug use: No   Sexual activity: Not Currently    Comment: pt states very seldem  Other Topics Concern   Not on file  Social History Narrative   Not on file   Social Determinants of Health   Financial Resource Strain: Low Risk    Difficulty of Paying Living Expenses: Not hard at all  Food Insecurity: No Food Insecurity   Worried About Running Out of  Food in the Last Year: Never true   [place] in the Last Year: Never true  Transportation Needs: No Transportation Needs   Lack of Transportation (Medical): No   Lack of Transportation (Non-Medical): No  Physical Activity: Inactive   Days of Exercise per Week: 0 days   Minutes of Exercise per Session: 0 min  Stress: No Stress Concern Present   Feeling of Stress : Not at all  Social Connections: Socially Isolated   Frequency of Communication with Friends and Family: More than three times a week   Frequency of Social Gatherings with Friends and Family: Twice a week   Attends Religious Services: Never   Marine scientist or Organizations: No   Attends Music therapist: Never   Marital Status: Divorced     Tobacco Counseling Ready to quit: Not Answered Counseling given: Not Answered   Clinical Intake:  Pre-visit preparation completed: Yes  Pain : No/denies pain     Nutritional Risks: None Diabetes: No  How often do you need to have someone help you when you read instructions, pamphlets, or other written materials from your doctor or pharmacy?: 1 - Never  Diabetic no  Interpreter Needed?: No  Information entered by :: Leroy Kennedy LPN   Activities of Daily Living In your present state of health, do you have any difficulty performing the following activities: 07/24/2021  Hearing? N  Vision? N  Difficulty concentrating or making decisions? N  Walking or climbing stairs? N  Dressing or bathing? N  Doing errands, shopping? N  Preparing Food and eating ? N  Using the Toilet? N  In the past six months, have you accidently leaked urine? N  Do you have problems with loss of bowel control? N  Managing your Medications? N  Managing your Finances? N  Housekeeping or managing your Housekeeping? N  Some recent data might be hidden    Patient Care Team: Venita Lick, NP as PCP - General (Nurse Practitioner) Abbie Sons, MD (Urology)  Indicate any recent Medical Services you may have received from other than [facility] providers in the past year (date may be approximate).     Assessment:   This is a routine wellness examination for [name].  Hearing/Vision screen Hearing Screening - Comments:: No trouble hearing Vision Screening - Comments:: Up to date [facility]  Dietary issues and exercise activities discussed: Current Exercise Habits: The patient does not participate in regular exercise at present, Exercise limited by: None identified   Goals Addressed   None    Depression Screen PHQ 2/9 Scores 07/24/2021 06/13/2021 05/13/2020 05/13/2019 02/20/2018 10/18/2017 10/30/2016  PHQ - 2 Score 0 0 0 0 0 0 0  PHQ- 9 Score 0 0 - - 0 - 0    Fall Risk Fall Risk   07/24/2021 05/13/2020 05/13/2019 08/25/2018 02/20/2018  Falls in the past year? 0 0 0 0 No  Number falls in past yr: 0 - 0 0 -  Injury with Fall? 0 - 0 0 -  Risk for fall due to : - Medication side effect - - -  Follow up Falls evaluation completed;Falls prevention discussed Falls evaluation completed;Education provided;Falls prevention discussed - - -    FALL RISK PREVENTION PERTAINING TO THE HOME:  Any stairs in or around the home? Yes  If so, are there any without handrails? No  Home free of loose throw rugs in walkways, pet beds, electrical cords, etc? Yes  Adequate lighting in your  home to reduce risk of falls? Yes   ASSISTIVE DEVICES UTILIZED TO PREVENT FALLS:  Life alert? No  Use of a cane, walker or w/c? No  Grab bars in the bathroom? No  Shower chair or bench in shower? No  Elevated toilet seat or a handicapped toilet? No   TIMED UP AND GO:  Was the test performed? No .    Cognitive Function:  Normal cognitive status assessed by direct observation by this Nurse Health Advisor. No abnormalities found.       6CIT Screen 05/13/2020 02/20/2018  What Year? 0 points 0 points  What month? 0 points 0 points  What time? 0 points 0 points  Count back from 20 0 points 0 points  Months in reverse 2 points 0 points  Repeat phrase 2 points 0 points  Total Score 4 0    Immunizations Immunization History  Administered Date(s) Administered   Influenza, High Dose Seasonal PF 02/20/2018   Influenza,inj,Quad PF,6+ Mos 04/30/2016   PFIZER(Purple Top)SARS-COV-2 Vaccination 08/24/2019, 09/14/2019, 06/12/2020   Pneumococcal Conjugate-13 10/18/2017   Pneumococcal Polysaccharide-23 01/05/2013, 12/12/2020   Tdap 03/10/2009   Zoster, Live 01/05/2013    TDAP status: Due, Education has been provided regarding the importance of this vaccine. Advised may receive this vaccine at local pharmacy or Health Dept. Aware to provide a copy of the vaccination record if obtained from local pharmacy  or Health Dept. Verbalized acceptance and understanding.  Flu Vaccine status: Due, Education has been provided regarding the importance of this vaccine. Advised may receive this vaccine at local pharmacy or Health Dept. Aware to provide a copy of the vaccination record if obtained from local pharmacy or Health Dept. Verbalized acceptance and understanding.  Pneumococcal vaccine status: Up to date  Covid-19 vaccine status: Information provided on how to obtain vaccines.   Qualifies for Shingles Vaccine? Yes   Zostavax completed Yes   Shingrix Completed?: No.    Education has been provided regarding the importance of this vaccine. Patient has been advised to call insurance company to determine out of pocket expense if they have not yet received this vaccine. Advised may also receive vaccine at local pharmacy or Health Dept. Verbalized acceptance and understanding.  Screening Tests Health Maintenance  Topic Date Due   COVID-19 Vaccine (4 - Booster for Pfizer series) 08/07/2020   INFLUENZA VACCINE  01/23/2021   Zoster Vaccines- Shingrix (1 of 2) 10/22/2021 (Originally 11/28/2001)   TETANUS/TDAP  12/12/2021 (Originally 03/11/2019)   COLONOSCOPY (Pts 45-26yrs Insurance coverage will need to be confirmed)  09/12/2024   Pneumonia Vaccine 74+ Years old  Completed   Hepatitis C Screening  Completed   HPV VACCINES  Aged Out    Health Maintenance  Health Maintenance Due  Topic Date Due   COVID-19 Vaccine (4 - Booster for [place] series) 08/07/2020   INFLUENZA VACCINE  01/23/2021    Colorectal cancer screening: Type of screening: Colonoscopy. Completed 2016. Repeat every 10 years  Lung Cancer Screening: (Low Dose CT Chest recommended if Age 62-80 years, 30 pack-year currently smoking OR have quit w/in 15years.) does qualify.   Lung Cancer Screening Referral: completed 2023  Additional Screening:  Hepatitis C Screening: does not qualify; Completed 2017  Vision Screening: Recommended annual  ophthalmology exams for early detection of glaucoma and other disorders of the eye. Is the patient up to date with their annual eye exam?  Yes  Who is the provider or what is the name of the office in which the patient attends  annual eye exams? [facility] If pt is not established with a provider, would they like to be referred to a provider to establish care? No .   Dental Screening: Recommended annual dental exams for proper oral hygiene  Community Resource Referral / Chronic Care Management: CRR required this visit?  No   CCM required this visit?  No      Plan:     I have personally reviewed and noted the following in the patients chart:   Medical and social history Use of alcohol, tobacco or illicit drugs  Current medications and supplements including opioid prescriptions. Patient is not currently taking opioid prescriptions. Functional ability and status Nutritional status Physical activity Advanced directives List of other physicians Hospitalizations, surgeries, and ER visits in previous 12 months Vitals Screenings to include cognitive, depression, and falls Referrals and appointments  In addition, I have reviewed and discussed with patient certain preventive protocols, quality metrics, and best practice recommendations. A written personalized care plan for preventive services as well as general preventive health recommendations were provided to patient.     Leroy Kennedy, LPN   [id number]/[id number]   Nurse Notes:

## 2021-07-25 ENCOUNTER — Other Ambulatory Visit: Payer: Self-pay

## 2021-07-25 ENCOUNTER — Encounter: Payer: Self-pay | Admitting: Nurse Practitioner

## 2021-07-25 ENCOUNTER — Ambulatory Visit (INDEPENDENT_AMBULATORY_CARE_PROVIDER_SITE_OTHER): Payer: Medicare Other | Admitting: Nurse Practitioner

## 2021-07-25 VITALS — BP 124/73 | HR 65 | Temp 98.4°F | Ht 71.3 in | Wt 170.2 lb

## 2021-07-25 DIAGNOSIS — Z789 Other specified health status: Secondary | ICD-10-CM | POA: Diagnosis not present

## 2021-07-25 DIAGNOSIS — F17210 Nicotine dependence, cigarettes, uncomplicated: Secondary | ICD-10-CM | POA: Diagnosis not present

## 2021-07-25 DIAGNOSIS — I493 Ventricular premature depolarization: Secondary | ICD-10-CM

## 2021-07-25 DIAGNOSIS — E538 Deficiency of other specified B group vitamins: Secondary | ICD-10-CM | POA: Diagnosis not present

## 2021-07-25 DIAGNOSIS — J432 Centrilobular emphysema: Secondary | ICD-10-CM

## 2021-07-25 DIAGNOSIS — I10 Essential (primary) hypertension: Secondary | ICD-10-CM

## 2021-07-25 DIAGNOSIS — D751 Secondary polycythemia: Secondary | ICD-10-CM | POA: Diagnosis not present

## 2021-07-25 DIAGNOSIS — I700 Atherosclerosis of aorta: Secondary | ICD-10-CM

## 2021-07-25 LAB — MICROALBUMIN, URINE WAIVED
Creatinine, Urine Waived: 200 mg/dL (ref 10–300)
Microalb, Ur Waived: 30 mg/L — ABNORMAL HIGH (ref 0–19)
Microalb/Creat Ratio: <30 mg/g (ref <30)

## 2021-07-25 NOTE — Assessment & Plan Note (Signed)
Discussed complete tobacco cessation. He is not interested at this time.

## 2021-07-25 NOTE — Assessment & Plan Note (Signed)
Patient does endorse daily alcohol use -- AUDIT = 8.  Recommend cutting back.

## 2021-07-25 NOTE — Progress Notes (Signed)
BP 124/73    Pulse 65    Temp 98.4 F (36.9 C) (Oral)    Ht 5' 11.3" (1.811 m)    Wt 170 lb 3.2 oz (77.2 kg)    SpO2 97%    BMI 23.54 kg/m    Subjective:    Patient ID: Billy Duncan, male    DOB: 1951/01/23, 71 y.o.   MRN: [id number]  HPI: Billy Duncan is a 71 y.o. male  Chief Complaint  Patient presents with   COPD    Patient states he is doing well.    Heart Rate   NOTE WRITTEN BY [employer] DNP STUDENT.  ASSESSMENT AND PLAN OF CARE REVIEWED WITH STUDENT, AGREE WITH ABOVE FINDINGS AND PLAN.   HYPERTENSION / HYPERLIPIDEMIA Returns today for follow-up due to changes to medications on 06/13/21 as having frequent PVCs on EKG -- stopped Amlodipine and started Metoprolol XL 25 MG daily.   Recent labs noted low B12 and folate, these were ordered for him to take, but he is not taking.states he was not aware they were ordered Satisfied with current treatment? yes Duration of hypertension: chronic BP monitoring frequency: not checking BP range:  BP medication side effects: no Duration of hyperlipidemia: chronic Cholesterol medication side effects: no Cholesterol supplements: none Medication compliance: good compliance Aspirin: yes Recent stressors: no Recurrent headaches: no Visual changes: no Palpitations: no Dyspnea: no Chest pain: no Lower extremity edema: no Dizzy/lightheaded: no   Flowsheet Row Clinical Support from 07/24/2021 in [facility]  Alcohol Use Disorder Identification Test Final Score (AUDIT) 8     States he drinks a couple glasses of wine nightly   COPD Noted on lung screening -- centrilobular and paraseptal  Emphysema + aortic atherosclerosis.  Continues to smoke, smokes < 1 PPD, not interested in quitting at this time. COPD status: stable Satisfied with current treatment?: yes Oxygen use: no Dyspnea frequency: none Cough frequency: none Rescue inhaler frequency:  not using any COD medications currently Limitation of activity:  no Productive cough: no Last Spirometry:  Pneumovax: Up to Date Influenza: Up to Date  Relevant past medical, surgical, family and social history reviewed and updated as indicated. Interim medical history since our last visit reviewed. Allergies and medications reviewed and updated.  Review of Systems  Constitutional:  Negative for activity change, diaphoresis, fatigue and fever.  HENT: Negative.    Eyes: Negative.   Respiratory:  Negative for cough, chest tightness, shortness of breath and wheezing.   Cardiovascular:  Negative for chest pain, palpitations and leg swelling.  Gastrointestinal: Negative.   Neurological:  Negative for dizziness, syncope, weakness, light-headedness, numbness and headaches.  Psychiatric/Behavioral: Negative.     Per HPI unless specifically indicated above     Objective:    BP 124/73    Pulse 65    Temp 98.4 F (36.9 C) (Oral)    Ht 5' 11.3" (1.811 m)    Wt 170 lb 3.2 oz (77.2 kg)    SpO2 97%    BMI 23.54 kg/m   Wt Readings from Last 3 Encounters:  07/25/21 170 lb 3.2 oz (77.2 kg)  06/13/21 166 lb 12.8 oz (75.7 kg)  03/17/21 160 lb (72.6 kg)    Physical Exam Vitals and nursing note reviewed.  Constitutional:      General: He is awake. He is not in acute distress.    Appearance: He is well-developed, well-groomed and underweight. He is not ill-appearing or toxic-appearing.  HENT:     Head:  Normocephalic and atraumatic.     Right Ear: Hearing normal. No drainage.     Left Ear: Hearing normal. No drainage.  Eyes:     General: Lids are normal.        Right eye: No discharge.        Left eye: No discharge.     Conjunctiva/sclera: Conjunctivae normal.     Pupils: Pupils are equal, round, and reactive to light.  Neck:     Vascular: No carotid bruit.  Cardiovascular:     Rate and Rhythm: Normal rate and regular rhythm.     Heart sounds: Normal heart sounds, S1 normal and S2 normal. No murmur heard.   No gallop.  Pulmonary:     Effort:  Pulmonary effort is normal. No accessory muscle usage or respiratory distress.     Breath sounds: Normal breath sounds. No wheezing or rhonchi.  Abdominal:     General: Abdomen is flat. Bowel sounds are normal.     Palpations: Abdomen is soft.  Musculoskeletal:        General: Normal range of motion.     Cervical back: Normal range of motion and neck supple.     Right lower leg: No edema.     Left lower leg: No edema.  Skin:    General: Skin is warm and dry.     Capillary Refill: Capillary refill takes less than 2 seconds.  Neurological:     Mental Status: He is alert and oriented to person, place, and time.  Psychiatric:        Attention and Perception: Attention normal.        Mood and Affect: Mood normal.        Speech: Speech normal.        Behavior: Behavior normal. Behavior is cooperative.        Thought Content: Thought content normal.   Results for orders placed or performed in visit on 07/25/21  Microalbumin, Urine Waived  Result Value Ref Range   Microalb, Ur Waived 30 (H) 0 - 19 mg/L   Creatinine, Urine Waived 200 10 - 300 mg/dL   Microalb/Creat Ratio <30 <30 mg/g      Assessment & Plan:   Problem List Items Addressed This Visit       Cardiovascular and Mediastinum   Atherosclerosis of aorta ([place])    Noted on lung CT screening.  Recommend complete cessation of smoking, pt not interested at this time.  Continue daily statin and ASA for prevention.      Frequent PVCs    Was started on  Metoprolol XL (due to COPD, preference for this BB) 25 MG daily to reduce HR and PVC burden. Heart rate and rhythm regular today. Continue this regimen. Pt declined Cardio referral last visit. Consider cardiology visit in future if patient agreeable.      Relevant Orders   Comprehensive metabolic panel   Microalbumin, Urine Waived (Completed)   Hypertension    Chronic, ongoing.  BP 124/73 today. Lisinopril reduced to 10 MG daily, with goal to change to ARB in future due to his  COPD underlying, was started on Metoprolol XL 25 MG due to PVCs frequently, tolerating well .  CMP and CBC today. Recommend he check BP at few days a week at home and document for provider visits.  Refills sent.  Return in 5 months.      Relevant Orders   Comprehensive metabolic panel   Microalbumin, Urine Waived (Completed)  Respiratory   Centrilobular emphysema ([place]) - Primary    Noted on CT scan screening. Denies shortness of breath or cough. Not on any inhalers at this time. Spirometry done in office today and continue yearly screening.  Recommend complete cessation of smoking.      Relevant Orders   CBC with Differential/Platelet   Spirometry with graph (Completed)     Other   Alcohol use    Patient does endorse daily alcohol use -- AUDIT = 8.  Recommend cutting back.      B12 deficiency    B12 supplement ordered last visit, patient have not started taken them. Will check levels on next visit.       Relevant Orders   CBC with Differential/Platelet   Folic acid deficiency    Folate and B12 supplement ordered last visit. Patient have not started taken them. Educated patients on their importance as levels were low on last visit. Patient states he will take them. Will check levels on next visit.      Relevant Orders   CBC with Differential/Platelet   Nicotine dependence, cigarettes, uncomplicated    Discussed complete tobacco cessation. He is not interested at this time.       Polycythemia    CBC and anemia panel ordered today.        Follow up plan: Return in about 5 months (around 12/22/2021) for COPD, HTN/HLD, FOLATE/B12 CHECK.

## 2021-07-25 NOTE — Assessment & Plan Note (Signed)
CBC and anemia panel ordered today. 

## 2021-07-25 NOTE — Assessment & Plan Note (Signed)
Folate and B12 supplement ordered last visit. Patient have not started taken them. Educated patients on their importance as levels were low on last visit. Patient states he will take them. Will check levels on next visit.

## 2021-07-25 NOTE — Assessment & Plan Note (Signed)
Noted on lung CT screening.  Recommend complete cessation of smoking, pt not interested at this time.  Continue daily statin and ASA for prevention. 

## 2021-07-25 NOTE — Assessment & Plan Note (Addendum)
B12 supplement ordered last visit, patient have not started taken them. Will check levels on next visit.

## 2021-07-25 NOTE — Assessment & Plan Note (Addendum)
Chronic, stable.no c/o reflux today Continue current regimen. Follow-up in 6 months.

## 2021-07-25 NOTE — Assessment & Plan Note (Signed)
Was started on  Metoprolol XL (due to COPD, preference for this BB) 25 MG daily to reduce HR and PVC burden. Heart rate and rhythm regular today. Continue this regimen. Pt declined Cardio referral last visit. Consider cardiology visit in future if patient agreeable.

## 2021-07-25 NOTE — Assessment & Plan Note (Addendum)
Noted on CT scan screening. Denies shortness of breath or cough. Not on any inhalers at this time. Spirometry done in office today and continue yearly screening.  Recommend complete cessation of smoking.

## 2021-07-25 NOTE — Assessment & Plan Note (Addendum)
Chronic, ongoing.  BP 124/73 today. Lisinopril reduced to 10 MG daily, with goal to change to ARB in future due to his COPD underlying, was started on Metoprolol XL 25 MG due to PVCs frequently, tolerating well .  CMP and CBC today. Recommend he check BP at few days a week at home and document for provider visits.  Refills sent.  Return in 5 months.

## 2021-07-26 LAB — CBC WITH DIFFERENTIAL/PLATELET
Basophils Absolute: 0.1 10*3/uL (ref 0.0–0.2)
Basos: 1 %
EOS (ABSOLUTE): 0.1 10*3/uL (ref 0.0–0.4)
Eos: 2 %
Hematocrit: 36 % — ABNORMAL LOW (ref 37.5–51.0)
Hemoglobin: 13.2 g/dL (ref 13.0–17.7)
Immature Grans (Abs): 0 10*3/uL (ref 0.0–0.1)
Immature Granulocytes: 0 %
Lymphocytes Absolute: 1.4 10*3/uL (ref 0.7–3.1)
Lymphs: 22 %
MCH: 37.2 pg — ABNORMAL HIGH (ref 26.6–33.0)
MCHC: 36.7 g/dL — ABNORMAL HIGH (ref 31.5–35.7)
MCV: 101 fL — ABNORMAL HIGH (ref 79–97)
Monocytes Absolute: 0.5 10*3/uL (ref 0.1–0.9)
Monocytes: 8 %
Neutrophils Absolute: 4.1 10*3/uL (ref 1.4–7.0)
Neutrophils: 67 %
Platelets: 189 10*3/uL (ref 150–450)
RBC: 3.55 x10E6/uL — ABNORMAL LOW (ref 4.14–5.80)
RDW: 11.9 % (ref 11.6–15.4)
WBC: 6.3 10*3/uL (ref 3.4–10.8)

## 2021-07-26 LAB — COMPREHENSIVE METABOLIC PANEL
ALT: 26 IU/L (ref 0–44)
AST: 35 IU/L (ref 0–40)
Albumin/Globulin Ratio: 2.4 — ABNORMAL HIGH (ref 1.2–2.2)
Albumin: 4.6 g/dL (ref 3.8–4.8)
Alkaline Phosphatase: 89 IU/L (ref 44–121)
BUN/Creatinine Ratio: 7 — ABNORMAL LOW (ref 10–24)
BUN: 8 mg/dL (ref 8–27)
Bilirubin Total: 1.2 mg/dL (ref 0.0–1.2)
CO2: 20 mmol/L (ref 20–29)
Calcium: 9.4 mg/dL (ref 8.6–10.2)
Chloride: 99 mmol/L (ref 96–106)
Creatinine, Ser: 1.11 mg/dL (ref 0.76–1.27)
Globulin, Total: 1.9 g/dL (ref 1.5–4.5)
Glucose: 95 mg/dL (ref 70–99)
Potassium: 4.8 mmol/L (ref 3.5–5.2)
Sodium: 134 mmol/L (ref 134–144)
Total Protein: 6.5 g/dL (ref 6.0–8.5)
eGFR: 72 mL/min/{1.73_m2} (ref >59)

## 2021-07-26 NOTE — Progress Notes (Signed)
Please let Asier know his labs have returned stable and at baseline for him, please ensure he starts taking his Folate and B12 daily that was ordered and continues all other medications.  Any questions? Keep being awesome!!  Thank you for allowing me to participate in your care.  I appreciate you. Kindest regards, Anila Bojarski

## 2021-08-02 ENCOUNTER — Other Ambulatory Visit: Payer: Self-pay | Admitting: Nurse Practitioner

## 2021-08-02 NOTE — Telephone Encounter (Signed)
Not on current med list. Requested Prescriptions  Pending Prescriptions Disp Refills   amLODipine (NORVASC) 5 MG tablet [Pharmacy Med Name: AMLODIPINE BESYLATE 5 MG TAB] 90 tablet 1    Sig: TAKE 1 TABLET (5 MG TOTAL) BY MOUTH DAILY.     Cardiovascular: Calcium Channel Blockers 2 Passed - 08/02/2021  1:37 AM      Passed - Last BP in normal range    BP Readings from Last 1 Encounters:  07/25/21 124/73         Passed - Last Heart Rate in normal range    Pulse Readings from Last 1 Encounters:  07/25/21 65         Passed - Valid encounter within last 6 months    Recent Outpatient Visits          1 week ago Centrilobular emphysema ([place])   [place] Cannady, Henrine Screws T, NP   1 month ago Centrilobular emphysema ([place])   [place] Cannady, Henrine Screws T, NP   7 months ago Primary hypertension   [place], Lauren A, NP   1 year ago Centrilobular emphysema ([place])   [place] Cannady, Henrine Screws T, NP   2 years ago Essential hypertension   [place], Barbaraann Faster, NP      Future Appointments            In 4 months Cannady, Barbaraann Faster, NP [employer] [facility], [place]   In 7 months Stoio[place], Ronda Fairly, MD [facility]

## 2021-12-01 ENCOUNTER — Other Ambulatory Visit: Payer: Self-pay | Admitting: Nurse Practitioner

## 2021-12-04 NOTE — Telephone Encounter (Signed)
Requested Prescriptions  Pending Prescriptions Disp Refills  . atorvastatin (LIPITOR) 20 MG tablet [Pharmacy Med Name: ATORVASTATIN 20 MG TABLET] 90 tablet 1    Sig: TAKE 1 TABLET BY MOUTH EVERY DAY     Cardiovascular:  Antilipid - Statins Failed - 12/01/2021  5:22 PM      Failed - Lipid Panel in normal range within the last 12 months    Cholesterol, Total  Date Value Ref Range Status  06/13/2021 170 100 - 199 mg/dL Final   Cholesterol Piccolo, Waived  Date Value Ref Range Status  01/17/2018 173 <200 mg/dL Final    Comment:                            Desirable                <200                         Borderline High      200- 239                         High                     >239    LDL Chol Calc (NIH)  Date Value Ref Range Status  06/13/2021 35 0 - 99 mg/dL Final   HDL  Date Value Ref Range Status  06/13/2021 121 >39 mg/dL Final   Triglycerides  Date Value Ref Range Status  06/13/2021 79 0 - 149 mg/dL Final   Triglycerides Piccolo,Waived  Date Value Ref Range Status  01/17/2018 90 <150 mg/dL Final    Comment:                            Normal                   <150                         Borderline High     150 - 199                         High                200 - 499                         Very High                >499          Passed - Patient is not pregnant      Passed - Valid encounter within last 12 months    Recent Outpatient Visits          4 months ago Centrilobular emphysema ([place])   [place], Jolene T, NP   5 months ago Centrilobular emphysema ([place])   [place], Barbaraann Faster, NP   11 months ago Primary hypertension   [place], Lauren A, NP   2 years ago Centrilobular emphysema ([place])   [place], [place] T, NP   3 years ago Essential hypertension   [place], Barbaraann Faster, NP      Future Appointments  In 2 weeks  Cannady, Barbaraann Faster, NP [employer] [facility], [place]   In 3 months Stoioff, Ronda Fairly, MD [place]

## 2021-12-13 ENCOUNTER — Other Ambulatory Visit: Payer: Self-pay | Admitting: Nurse Practitioner

## 2021-12-13 NOTE — Telephone Encounter (Signed)
Requested Prescriptions  Pending Prescriptions Disp Refills  . lisinopril (ZESTRIL) 10 MG tablet [Pharmacy Med Name: LISINOPRIL 10 MG TABLET] 180 tablet 4    Sig: TAKE 2 TABLETS BY MOUTH EVERY DAY     Cardiovascular:  ACE Inhibitors Passed - 12/13/2021  1:44 AM      Passed - Cr in normal range and within 180 days    Creatinine, Ser  Date Value Ref Range Status  07/25/2021 1.11 0.76 - 1.27 mg/dL Final         Passed - K in normal range and within 180 days    Potassium  Date Value Ref Range Status  07/25/2021 4.8 3.5 - 5.2 mmol/L Final         Passed - Patient is not pregnant      Passed - Last BP in normal range    BP Readings from Last 1 Encounters:  07/25/21 124/73         Passed - Valid encounter within last 6 months    Recent Outpatient Visits          4 months ago Centrilobular emphysema ([place])   [place] Cannady, Jolene T, NP   6 months ago Centrilobular emphysema ([place])   [place]el[place], Henrine Screws T, NP   1 year ago Primary hypertension   [place], Lauren A, NP   2 years ago Centrilobular emphysema ([place])   [place] [facility], [facility] T, NP   3 years ago Essential hypertension   [place], Barbaraann Faster, NP      Future Appointments            In 1 week Cannady, Barbaraann Faster, NP [employer], [place]   In 3 months Stoioff, Ronda Fairly, MD [place]

## 2021-12-22 ENCOUNTER — Ambulatory Visit (INDEPENDENT_AMBULATORY_CARE_PROVIDER_SITE_OTHER): Payer: Medicare Other | Admitting: Nurse Practitioner

## 2021-12-22 ENCOUNTER — Encounter: Payer: Self-pay | Admitting: Nurse Practitioner

## 2021-12-22 VITALS — BP 138/73 | HR 73 | Temp 98.2°F | Wt 162.0 lb

## 2021-12-22 DIAGNOSIS — I493 Ventricular premature depolarization: Secondary | ICD-10-CM | POA: Diagnosis not present

## 2021-12-22 DIAGNOSIS — I700 Atherosclerosis of aorta: Secondary | ICD-10-CM

## 2021-12-22 DIAGNOSIS — D751 Secondary polycythemia: Secondary | ICD-10-CM

## 2021-12-22 DIAGNOSIS — Z23 Encounter for immunization: Secondary | ICD-10-CM | POA: Diagnosis not present

## 2021-12-22 DIAGNOSIS — E538 Deficiency of other specified B group vitamins: Secondary | ICD-10-CM

## 2021-12-22 DIAGNOSIS — J432 Centrilobular emphysema: Secondary | ICD-10-CM

## 2021-12-22 DIAGNOSIS — D508 Other iron deficiency anemias: Secondary | ICD-10-CM

## 2021-12-22 DIAGNOSIS — Z789 Other specified health status: Secondary | ICD-10-CM | POA: Diagnosis not present

## 2021-12-22 DIAGNOSIS — E782 Mixed hyperlipidemia: Secondary | ICD-10-CM | POA: Diagnosis not present

## 2021-12-22 DIAGNOSIS — S41102A Unspecified open wound of left upper arm, initial encounter: Secondary | ICD-10-CM | POA: Diagnosis not present

## 2021-12-22 DIAGNOSIS — I10 Essential (primary) hypertension: Secondary | ICD-10-CM

## 2021-12-22 DIAGNOSIS — F17210 Nicotine dependence, cigarettes, uncomplicated: Secondary | ICD-10-CM | POA: Diagnosis not present

## 2021-12-22 MED ORDER — VITAMIN B-12 1000 MCG PO TABS: 1000.0000 ug | ORAL_TABLET | Freq: Every day | ORAL | 4 refills | Status: DC

## 2021-12-22 NOTE — Assessment & Plan Note (Signed)
Noted on lung CT screening.  Recommend complete cessation of smoking, pt not interested at this time.  Continue daily statin and ASA for prevention.

## 2021-12-22 NOTE — Assessment & Plan Note (Signed)
Chronic, ongoing.  BP at goal today.  Recommend he monitor BP at least a few mornings a week at home and document.  DASH diet at home.  Continue current medication regimen and adjust as needed.  Labs today: CBC, CMP.  Return in 6 months.

## 2021-12-22 NOTE — Progress Notes (Signed)
BP 138/73   Pulse 73   Temp 98.2 F (36.8 C) (Oral)   Wt 162 lb (73.5 kg)   SpO2 98%   BMI 22.40 kg/m    Subjective:    Patient ID: Billy Duncan, male    DOB: 08/23/1950, 71 y.o.   MRN: [id number]  HPI: Billy Duncan is a 71 y.o. male  Chief Complaint  Patient presents with   Hypertension   Hyperlipidemia   Anemia   COPD   Small open wound to left upper arm due to puncture wound while working at home.  HYPERTENSION / HYPERLIPIDEMIA Continues on Metoprolol XL 25 MG daily for HTN and PVCs & Lisinopril + Atorvastatin daily. Satisfied with current treatment? yes Duration of hypertension: chronic BP monitoring frequency: not checking BP range:  BP medication side effects: no Duration of hyperlipidemia: chronic Cholesterol medication side effects: no Cholesterol supplements: none Medication compliance: good compliance Aspirin: yes Recent stressors: no Recurrent headaches: no Visual changes: no Palpitations: no, these have decreased with Metoprolol Dyspnea: no Chest pain: no Lower extremity edema: no Dizzy/lightheaded: no The ASCVD Risk score (Arnett DK, et al., 2019) failed to calculate for the following reasons:   The valid HDL cholesterol range is 20 to 100 mg/dL   ANEMIA Is to be taking both Folic acid and G81 for low levels.  Has daily alcohol use.  Is taking Folic Acid, but not taking B12 as was instructed.  He reports continuing to drink a couple glasses of wine every night. Anemia status: stable Etiology of anemia: ?alcohol use Duration of anemia treatment: unknow Compliance with treatment: good compliance Iron supplementation side effects: no Severity of anemia: mild Fatigue: no Decreased exercise tolerance: no  Dyspnea on exertion: no Palpitations: no Bleeding: no Pica: no  Flowsheet Row Clinical Support from 07/24/2021 in [facility]  Alcohol Use Disorder Identification Test Final Score (AUDIT) 8      COPD Centrilobular and  paraseptal emphysema + aortic atherosclerosis noted on lung cancer screening.  Continues to smoke, smokes < 1 PPD, not interested in quitting at this time. COPD status: stable Satisfied with current treatment?: yes Oxygen use: no Dyspnea frequency: none Cough frequency: none Rescue inhaler frequency:  not using any COPD medications currently Limitation of activity: no Productive cough: no Last Spirometry:  Pneumovax: Up to Date Influenza: Up to Date  Relevant past medical, surgical, family and social history reviewed and updated as indicated. Interim medical history since our last visit reviewed. Allergies and medications reviewed and updated.  Review of Systems  Constitutional:  Negative for activity change, diaphoresis, fatigue and fever.  HENT: Negative.    Eyes: Negative.   Respiratory:  Negative for cough, chest tightness, shortness of breath and wheezing.   Cardiovascular:  Negative for chest pain, palpitations and leg swelling.  Gastrointestinal: Negative.   Neurological:  Negative for dizziness, syncope, weakness, light-headedness, numbness and headaches.  Psychiatric/Behavioral: Negative.      Per HPI unless specifically indicated above     Objective:    BP 138/73   Pulse 73   Temp 98.2 F (36.8 C) (Oral)   Wt 162 lb (73.5 kg)   SpO2 98%   BMI 22.40 kg/m   Wt Readings from Last 3 Encounters:  12/22/21 162 lb (73.5 kg)  07/25/21 170 lb 3.2 oz (77.2 kg)  06/13/21 166 lb 12.8 oz (75.7 kg)    Physical Exam Vitals and nursing note reviewed.  Constitutional:      General: He is  awake. He is not in acute distress.    Appearance: He is well-developed, well-groomed and underweight. He is not ill-appearing or toxic-appearing.  HENT:     Head: Normocephalic and atraumatic.     Right Ear: Hearing normal. No drainage.     Left Ear: Hearing normal. No drainage.  Eyes:     General: Lids are normal.        Right eye: No discharge.        Left eye: No discharge.      Conjunctiva/sclera: Conjunctivae normal.     Pupils: Pupils are equal, round, and reactive to light.  Neck:     Vascular: No carotid bruit.  Cardiovascular:     Rate and Rhythm: Normal rate and regular rhythm.     Heart sounds: Normal heart sounds, S1 normal and S2 normal. No murmur heard.    No gallop.  Pulmonary:     Effort: Pulmonary effort is normal. No accessory muscle usage or respiratory distress.     Breath sounds: Normal breath sounds. No wheezing or rhonchi.  Abdominal:     General: Abdomen is flat. Bowel sounds are normal.     Palpations: Abdomen is soft.  Musculoskeletal:        General: Normal range of motion.     Cervical back: Normal range of motion and neck supple.     Right lower leg: No edema.     Left lower leg: No edema.  Skin:    General: Skin is warm and dry.     Capillary Refill: Capillary refill takes less than 2 seconds.  Neurological:     Mental Status: He is alert and oriented to person, place, and time.  Psychiatric:        Attention and Perception: Attention normal.        Mood and Affect: Mood normal.        Speech: Speech normal.        Behavior: Behavior normal. Behavior is cooperative.        Thought Content: Thought content normal.    Results for orders placed or performed in visit on 07/25/21  Comprehensive metabolic panel  Result Value Ref Range   Glucose 95 70 - 99 mg/dL   BUN 8 8 - 27 mg/dL   Creatinine, Ser 1.11 0.76 - 1.27 mg/dL   eGFR 72 >59 mL/min/1.73   BUN/Creatinine Ratio 7 (L) 10 - 24   Sodium 134 134 - 144 mmol/L   Potassium 4.8 3.5 - 5.2 mmol/L   Chloride 99 96 - 106 mmol/L   CO2 20 20 - 29 mmol/L   Calcium 9.4 8.6 - 10.2 mg/dL   Total Protein 6.5 6.0 - 8.5 g/dL   Albumin 4.6 3.8 - 4.8 g/dL   Globulin, Total 1.9 1.5 - 4.5 g/dL   Albumin/Globulin Ratio 2.4 (H) 1.2 - 2.2   Bilirubin Total 1.2 0.0 - 1.2 mg/dL   Alkaline Phosphatase 89 44 - 121 IU/L   AST 35 0 - 40 IU/L   ALT 26 0 - 44 IU/L  CBC with  Differential/Platelet  Result Value Ref Range   WBC 6.3 3.4 - 10.8 x10E3/uL   RBC 3.55 (L) 4.14 - 5.80 x10E6/uL   Hemoglobin 13.2 13.0 - 17.7 g/dL   Hematocrit 36.0 (L) 37.5 - 51.0 %   MCV 101 (H) 79 - 97 fL   MCH 37.2 (H) 26.6 - 33.0 pg   MCHC 36.7 (H) 31.5 - 35.7 g/dL   RDW 11.9 11.6 -  15.4 %   Platelets 189 150 - 450 x10E3/uL   Neutrophils 67 Not Estab. %   Lymphs 22 Not Estab. %   Monocytes 8 Not Estab. %   Eos 2 Not Estab. %   Basos 1 Not Estab. %   Neutrophils Absolute 4.1 1.4 - 7.0 x10E3/uL   Lymphocytes Absolute 1.4 0.7 - 3.1 x10E3/uL   Monocytes Absolute 0.5 0.1 - 0.9 x10E3/uL   EOS (ABSOLUTE) 0.1 0.0 - 0.4 x10E3/uL   Basophils Absolute 0.1 0.0 - 0.2 x10E3/uL   Immature Granulocytes 0 Not Estab. %   Immature Grans (Abs) 0.0 0.0 - 0.1 x10E3/uL   Hematology Comments: Note:   Microalbumin, Urine Waived  Result Value Ref Range   Microalb, Ur Waived 30 (H) 0 - 19 mg/L   Creatinine, Urine Waived 200 10 - 300 mg/dL   Microalb/Creat Ratio <30 <30 mg/g      Assessment & Plan:   Problem List Items Addressed This Visit       Cardiovascular and Mediastinum   Atherosclerosis of aorta ([place])    Noted on lung CT screening.  Recommend complete cessation of smoking, pt not interested at this time.  Continue daily statin and ASA for prevention.      Relevant Orders   Comprehensive metabolic panel   Lipid Panel w/o Chol/HDL Ratio   Frequent PVCs    Ongoing and stable.  Continues on Metoprolol XL (due to COPD, preference for this BB) 25 MG daily to reduce HR and PVC burden. Heart rate and rhythm regular today. Continue this regimen. Declines cardiology referal. Consider cardiology visit in future if patient agreeable.      Hypertension    Chronic, ongoing.  BP at goal today.  Recommend he monitor BP at least a few mornings a week at home and document.  DASH diet at home.  Continue current medication regimen and adjust as needed.  Labs today: CBC, CMP.  Return in 6 months.        Relevant Orders   Comprehensive metabolic panel   CBC with Differential/Platelet     Respiratory   Centrilobular emphysema (H[facility]) - Primary    Chronic, stable.  Noted on CT scan screening. Denies shortness of breath or cough. Not on any inhalers at this time. Spirometry done in office today and continue yearly screening.  Recommend complete cessation of smoking.      Relevant Orders   CBC with Differential/Platelet     Other   Alcohol use    Patient does endorse daily alcohol use -- AUDIT = 8 recent check.  Recommend cutting back.      Relevant Orders   Comprehensive metabolic panel   Vitamin I77   Iron Binding Cap (TIBC)([facility])   Ferritin   Folate   B12 deficiency    Chronic and ongoing.  Has not started supplement.  B12 supplement ordered last visit. Will check levels today.  Highly recommend he start supplement.      Relevant Orders   CBC with Differential/Platelet   Vitamin B12   Iron Binding Cap (TIBC)([facility])   Ferritin   Folate   Folic acid deficiency    Chronic and ongoing.  Folate and B12 supplement ordered, taking folate only. Continue folate and highly recommend he take B12. Patient states he will take them. Will check levels today.      Relevant Orders   CBC with Differential/Platelet   Iron Binding Cap (TIBC)([facility])   Ferritin   Folate   Mixed hyperlipidemia  Chronic, ongoing.  Continue current medication regimen and adjust as needed.  Lipid panel today.      Relevant Orders   Comprehensive metabolic panel   Lipid Panel w/o Chol/HDL Ratio   Nicotine dependence, cigarettes, uncomplicated    I have recommended complete cessation of tobacco use. I have discussed various options available for assistance with tobacco cessation including over the counter methods (Nicotine gum, patch and lozenges). We also discussed prescription options (Chantix, Nicotine Inhaler / Nasal Spray). The patient is not interested in pursuing any  prescription tobacco cessation options at this time.       Polycythemia    CBC and anemia panel ordered today.      Relevant Orders   CBC with Differential/Platelet   Iron Binding Cap (TIBC)([facility])   Ferritin   Folate   Other Visit Diagnoses     Iron deficiency anemia secondary to inadequate dietary iron intake       Check levels today.   Relevant Medications   vitamin B-12 (CYANOCOBALAMIN) 1000 MCG tablet   Other Relevant Orders   Iron Binding Cap (TIBC)([facility])   Ferritin   Open wound of left upper arm, initial encounter       Small to left upper arm due to puncture wound, is healing.  Update Td vaccine.   Relevant Orders   Td vaccine greater than or equal to 7yo preservative free IM (Completed)        Follow up plan: Return in about 6 months (around 06/23/2022) for HTN/HLD, COPD, B12 and Folate DEFICIENCY, GERD.

## 2021-12-22 NOTE — Assessment & Plan Note (Addendum)
Chronic and ongoing.  Has not started supplement.  B12 supplement ordered last visit. Will check levels today.  Highly recommend he start supplement.

## 2021-12-22 NOTE — Assessment & Plan Note (Signed)
I have recommended complete cessation of tobacco use. I have discussed various options available for assistance with tobacco cessation including over the counter methods (Nicotine gum, patch and lozenges). We also discussed prescription options (Chantix, Nicotine Inhaler / Nasal Spray). The patient is not interested in pursuing any prescription tobacco cessation options at this time.  

## 2021-12-22 NOTE — Assessment & Plan Note (Signed)
Patient does endorse daily alcohol use -- AUDIT = 8 recent check.  Recommend cutting back.

## 2021-12-22 NOTE — Patient Instructions (Addendum)
Start taking Vitamin B12 1000 MCG daily.  Vitamin B12 and Folate Test Why am I having this test? Vitamin Z16 and folate (folic acid) are B vitamins needed to make red blood cells and keep your nervous system healthy. Vitamin B12 is in foods such as meats, eggs, dairy products, and fish. Folate is in fruits, beans, and leafy green vegetables. Some foods, such as whole grains, bread, and cereals have vitamin B12 added to them (are fortified). You may not have enough of these B vitamins (have a deficiency) if your diet lacks these vitamins. Low levels can also be caused by diseases or having had surgeries on your stomach or small intestine that interfere with your ability to absorb the vitamins from your food. You may have a vitamin B12 and folate test if: You have symptoms of vitamin B12 or folate deficiency, such as tiredness (fatigue), headache, confusion, poor balance, or tingling and numbness in your hands and feet. You are pregnant or breastfeeding. Women who are pregnant or breastfeeding need more folate and may need to take dietary supplements. Your red blood cell count is low (anemia). You are an older person and have mental confusion. You have a disease or condition that may lead to a deficiency of these B vitamins. What is being tested? This test measures the amount of vitamin B12 and folate in your blood. The tests for vitamin B12 and folate may be done together or separately. What kind of sample is taken?  A blood sample is required for this test. It is usually collected by inserting a needle into a blood vessel. How do I prepare for this test? Follow instructions from your health care provider about eating and drinking before the test. Tell a health care provider about: All medicines you are taking, including vitamins, herbs, eye drops, creams, and over-the-counter medicines. Any medical conditions you have. Whether you are pregnant or may be pregnant. How often you drink  alcohol. How are the results reported? Your test results will be reported as values that identify the amount of vitamin B12 and folate in your blood. Your health care provider will compare your results to normal ranges that were established after testing a large group of people (reference ranges). Reference ranges may vary among labs and hospitals. For this test, common reference ranges are: Vitamin B12: 160-950 pg/mL or 118-701 pmol/L (SI units). Folate: 5-25 ng/mL or 11-57 nmol/L (SI units). What do the results mean? Results within the reference range are considered normal. Vitamin B12 or folate levels that are lower than the reference range may be caused by: Poor nutrition or eating a vegetarian or vegan diet that does not include any foods that come from animals. Having alcoholism. Having certain diseases that make it hard to absorb vitamin B12. These diseases include Crohn's disease, chronic pancreatitis, and cystic fibrosis. Taking certain medicines. Having had surgeries on your stomach or small intestine. High levels of vitamin B12 are rare, but they may happen if you have: Cancer. Liver disease. High levels of folate may happen if: You have anemia. You are vegetarian. You have had a recent blood transfusion. Talk with your health care provider about what your results mean. Questions to ask your health care provider Ask your health care provider, or the department that is doing the test: When will my results be ready? How will I get my results? What are my treatment options? What other tests do I need? What are my next steps? Summary Vitamin R67 and folate (folic acid)  are both B vitamins that are needed to make red blood cells and to keep your nervous system healthy. You may not have enough B vitamins in your body if you do not get enough in your diet or if you have a disease that makes it hard to absorb vitamin B12. This test measures the amount of vitamin B12 and folate in  your blood. A blood sample is required for the test. Talk with your health care provider about what your results mean. This information is not intended to replace advice given to you by your health care provider. Make sure you discuss any questions you have with your health care provider. Document Revised: 02/03/2021 Document Reviewed: 02/03/2021 Elsevier Patient Education  [place].

## 2021-12-22 NOTE — Assessment & Plan Note (Signed)
CBC and anemia panel ordered today.

## 2021-12-22 NOTE — Assessment & Plan Note (Signed)
Chronic, ongoing.  Continue current medication regimen and adjust as needed. Lipid panel today. 

## 2021-12-22 NOTE — Assessment & Plan Note (Signed)
Chronic and ongoing.  Folate and B12 supplement ordered, taking folate only. Continue folate and highly recommend he take B12. Patient states he will take them. Will check levels today.

## 2021-12-22 NOTE — Assessment & Plan Note (Signed)
Chronic, stable.  Noted on CT scan screening. Denies shortness of breath or cough. Not on any inhalers at this time. Spirometry done in office today and continue yearly screening.  Recommend complete cessation of smoking.

## 2021-12-22 NOTE — Assessment & Plan Note (Signed)
Ongoing and stable.  Continues on Metoprolol XL (due to COPD, preference for this BB) 25 MG daily to reduce HR and PVC burden. Heart rate and rhythm regular today. Continue this regimen. Declines cardiology referal. Consider cardiology visit in future if patient agreeable. 

## 2021-12-23 LAB — CBC WITH DIFFERENTIAL/PLATELET
Basophils Absolute: 0.1 10*3/uL (ref 0.0–0.2)
Basos: 1 %
EOS (ABSOLUTE): 0.1 10*3/uL (ref 0.0–0.4)
Eos: 3 %
Hematocrit: 39.5 % (ref 37.5–51.0)
Hemoglobin: 13.9 g/dL (ref 13.0–17.7)
Immature Grans (Abs): 0 10*3/uL (ref 0.0–0.1)
Immature Granulocytes: 1 %
Lymphocytes Absolute: 1.3 10*3/uL (ref 0.7–3.1)
Lymphs: 31 %
MCH: 33.1 pg — ABNORMAL HIGH (ref 26.6–33.0)
MCHC: 35.2 g/dL (ref 31.5–35.7)
MCV: 94 fL (ref 79–97)
Monocytes Absolute: 0.4 10*3/uL (ref 0.1–0.9)
Monocytes: 10 %
Neutrophils Absolute: 2.3 10*3/uL (ref 1.4–7.0)
Neutrophils: 54 %
Platelets: 173 10*3/uL (ref 150–450)
RBC: 4.2 x10E6/uL (ref 4.14–5.80)
RDW: 12.4 % (ref 11.6–15.4)
WBC: 4.3 10*3/uL (ref 3.4–10.8)

## 2021-12-23 LAB — LIPID PANEL W/O CHOL/HDL RATIO
Cholesterol, Total: 175 mg/dL (ref 100–199)
HDL: 120 mg/dL (ref >39)
LDL Chol Calc (NIH): 42 mg/dL (ref 0–99)
Triglycerides: 71 mg/dL (ref 0–149)
VLDL Cholesterol Cal: 13 mg/dL (ref 5–40)

## 2021-12-23 LAB — COMPREHENSIVE METABOLIC PANEL
ALT: 35 IU/L (ref 0–44)
AST: 50 IU/L — ABNORMAL HIGH (ref 0–40)
Albumin/Globulin Ratio: 2 (ref 1.2–2.2)
Albumin: 4.7 g/dL (ref 3.8–4.8)
Alkaline Phosphatase: 87 IU/L (ref 44–121)
BUN/Creatinine Ratio: 7 — ABNORMAL LOW (ref 10–24)
BUN: 8 mg/dL (ref 8–27)
Bilirubin Total: 1.6 mg/dL — ABNORMAL HIGH (ref 0.0–1.2)
CO2: 19 mmol/L — ABNORMAL LOW (ref 20–29)
Calcium: 10.1 mg/dL (ref 8.6–10.2)
Chloride: 94 mmol/L — ABNORMAL LOW (ref 96–106)
Creatinine, Ser: 1.09 mg/dL (ref 0.76–1.27)
Globulin, Total: 2.3 g/dL (ref 1.5–4.5)
Glucose: 97 mg/dL (ref 70–99)
Potassium: 4.8 mmol/L (ref 3.5–5.2)
Sodium: 132 mmol/L — ABNORMAL LOW (ref 134–144)
Total Protein: 7 g/dL (ref 6.0–8.5)
eGFR: 73 mL/min/{1.73_m2} (ref >59)

## 2021-12-23 LAB — IRON AND TIBC
Iron Saturation: 55 % (ref 15–55)
Iron: 182 ug/dL — ABNORMAL HIGH (ref 38–169)
Total Iron Binding Capacity: 330 ug/dL (ref 250–450)
UIBC: 148 ug/dL (ref 111–343)

## 2021-12-23 LAB — FERRITIN: Ferritin: 305 ng/mL (ref 30–400)

## 2021-12-23 LAB — VITAMIN B12: Vitamin B-12: 297 pg/mL (ref 232–1245)

## 2021-12-23 LAB — FOLATE: Folate: 19.1 ng/mL (ref >3.0)

## 2021-12-24 NOTE — Progress Notes (Signed)
Good morning Billy Duncan, please let Mylan know his labs have returned: - Kidney function, creatinine and eGFR, remains normal, but liver function shows mild elevation in AST -- please cut back on alcohol use.  Sodium level is also on lower side, again cut back on alcohol and add a little tablet salt to diet. - Iron level remains a little elevated but is coming down.  No iron supplement at home please or multivitamin -- hold these if taking. - Cholesterol levels are at goal, continue statin daily. - B12 level remains on lower side, please ensure to take your B12 supplement I ordered every day. - Folate is improving, please ensure to continue to take folate supplement I ordered daily.  Any questions? Keep being stellar!!  Thank you for allowing me to participate in your care.  I appreciate you. Kindest regards, Aashka Salomone

## 2022-02-20 IMAGING — CT CT CHEST LUNG CANCER SCREENING LOW DOSE W/O CM
2 of 5 series · 15 of 40 positions shown, 18 images · non-contrast
Comparison: 12/29/2019

CLINICAL DATA: 70-year-old male with 49 pack year history of
smoking. Lung cancer screening.

EXAM:
CT CHEST WITHOUT CONTRAST LOW-DOSE FOR LUNG CANCER SCREENING
TECHNIQUE: Multidetector CT imaging of the chest was performed following the
standard protocol without IV contrast.

[Series 3: lung 1.00 · axial · 0.69mm/px · z∈[-1215,-888]mm · 12 of 361 slices shown, 15 images]
[im 17/361  mediastinal]
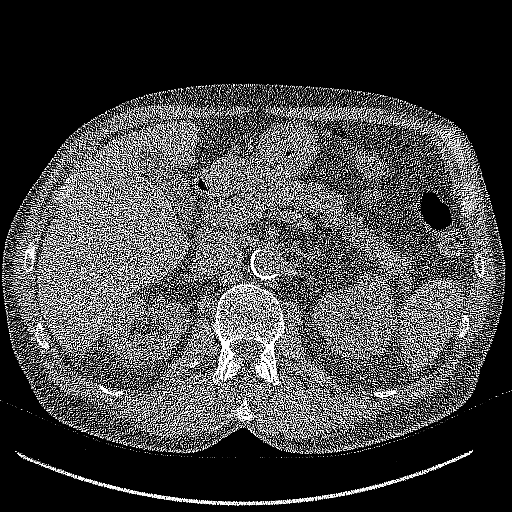
[im 17/361  lung]
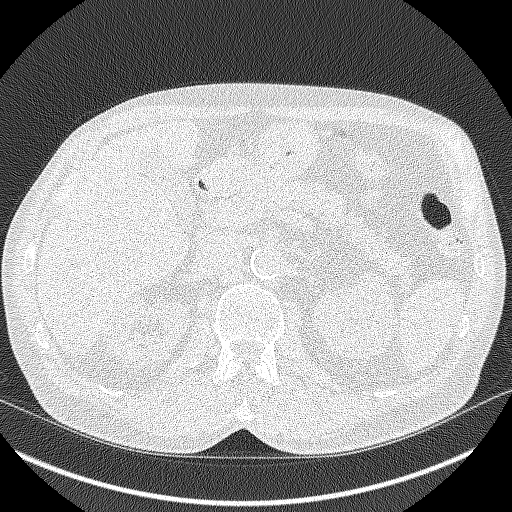
[im 50/361  lung]
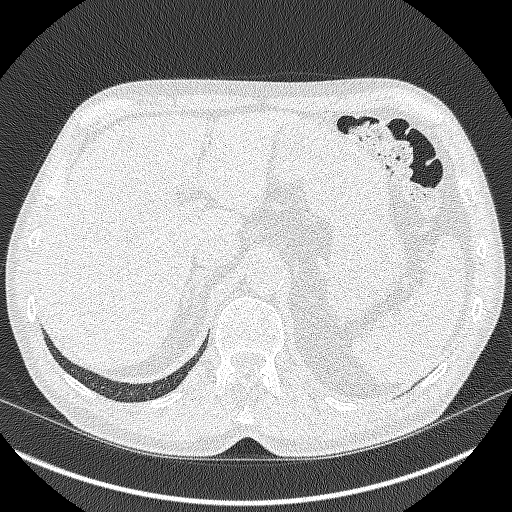
[im 82/361  lung]
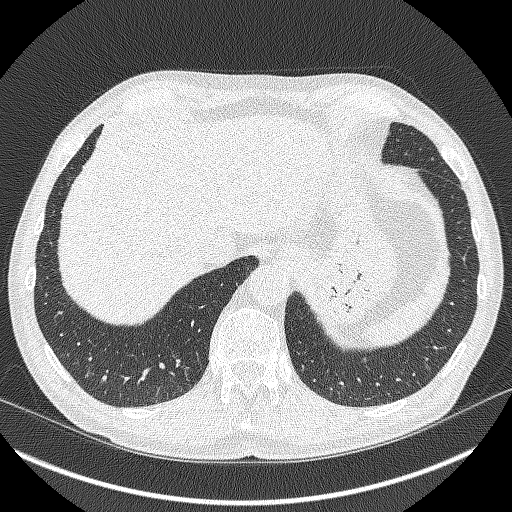
[im 115/361  lung]
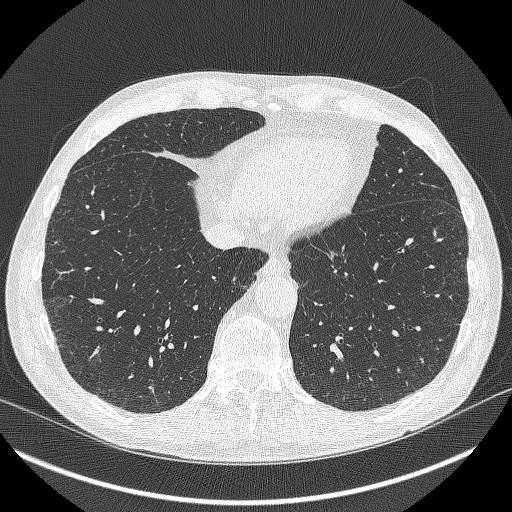
[im 131/361  mediastinal]
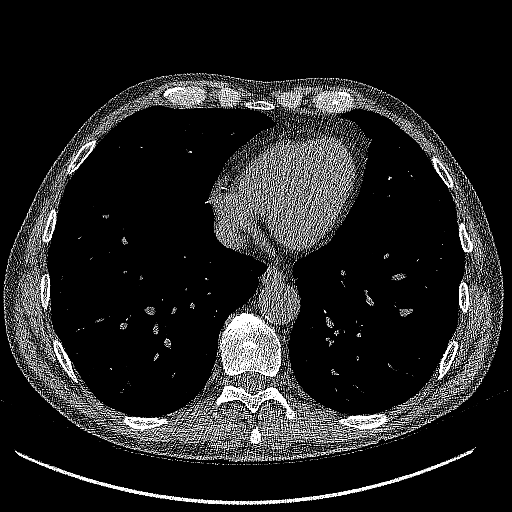
[im 131/361  lung]
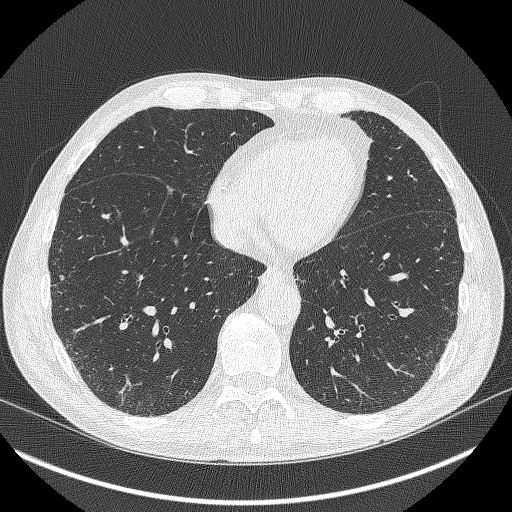
[im 164/361  lung]
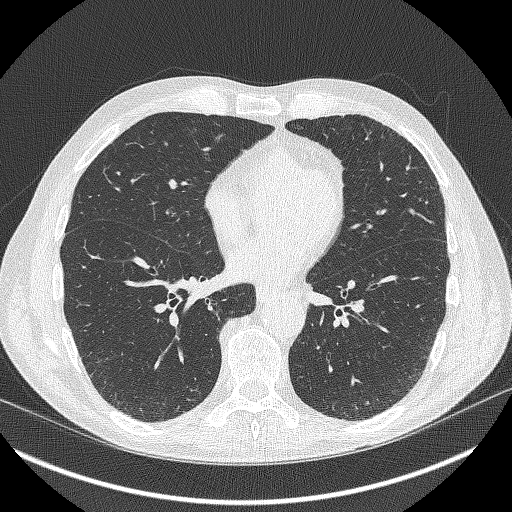
[im 197/361  lung]
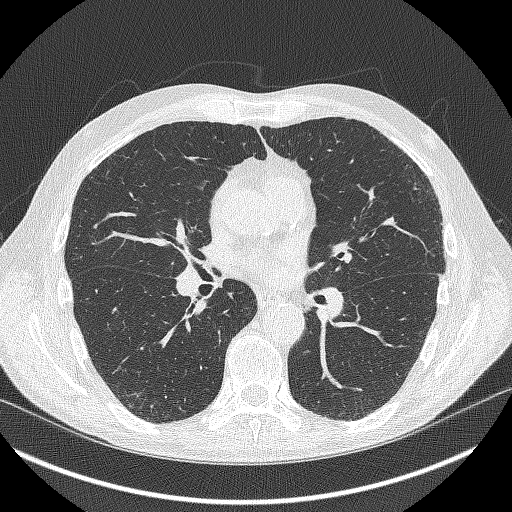
[im 230/361  lung]
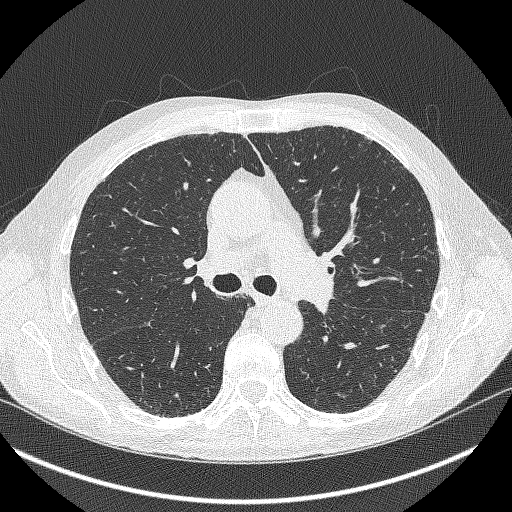
[im 246/361  mediastinal]
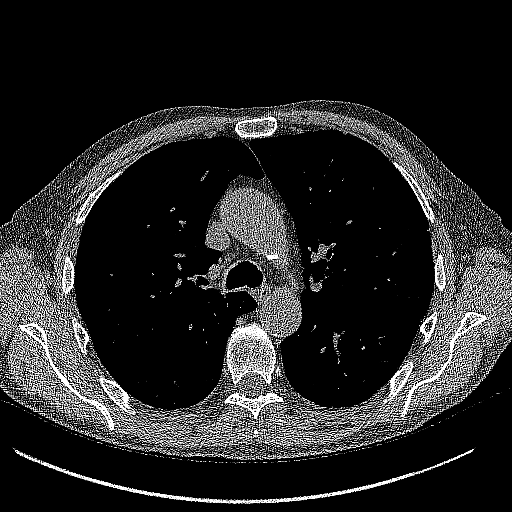
[im 246/361  lung]
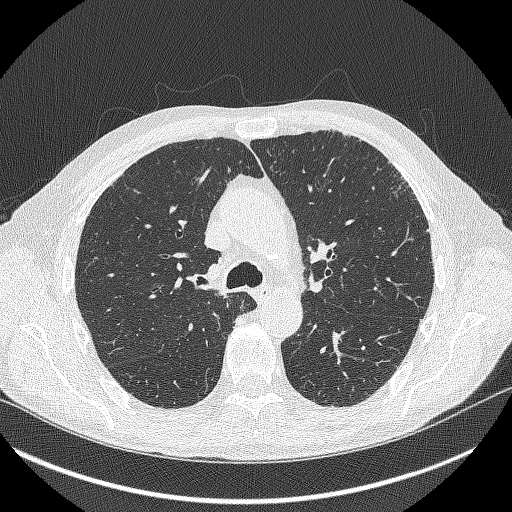
[im 279/361  lung]
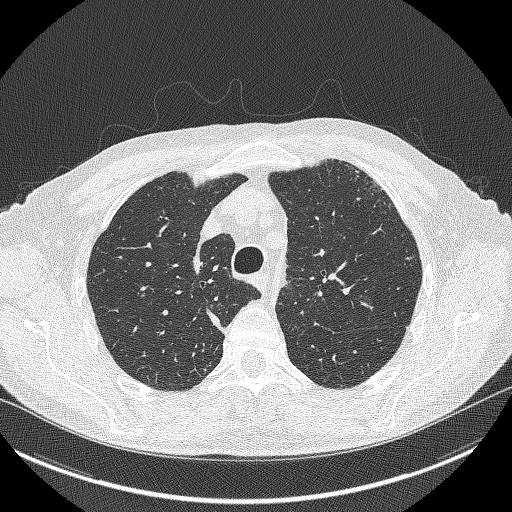
[im 311/361  lung]
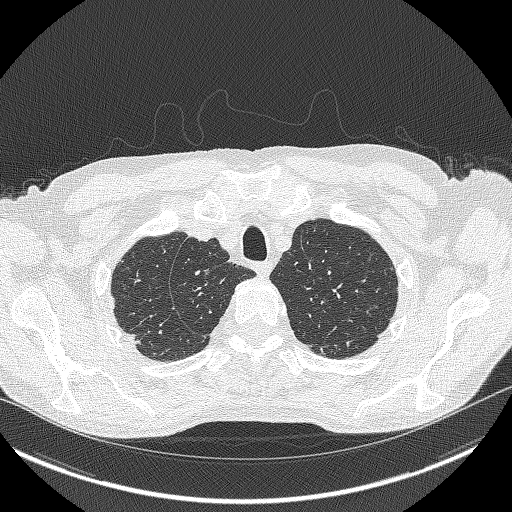
[im 344/361  lung]
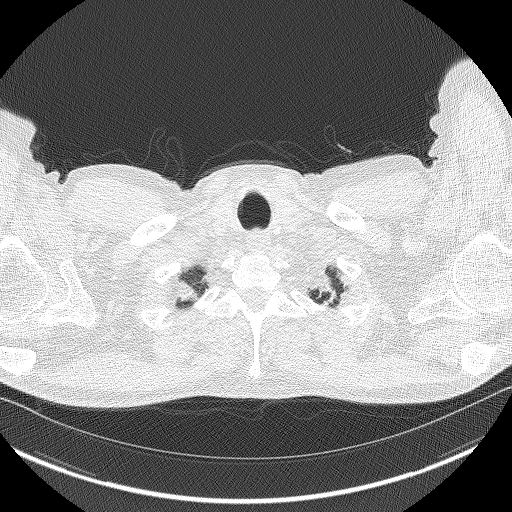

[Series 5: coronals lung 1.00 cor · coronal · 0.69mm/px · 3 of 351 slices shown]
[im 71/351  lung]
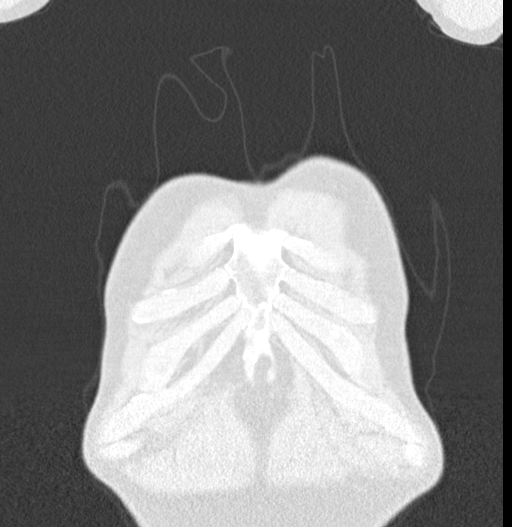
[im 141/351  lung]
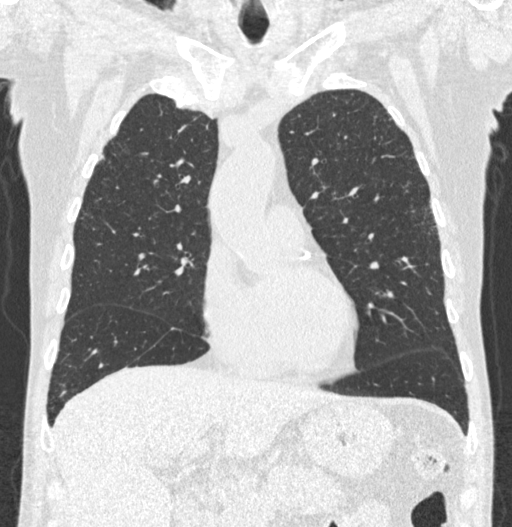
[im 211/351  lung]
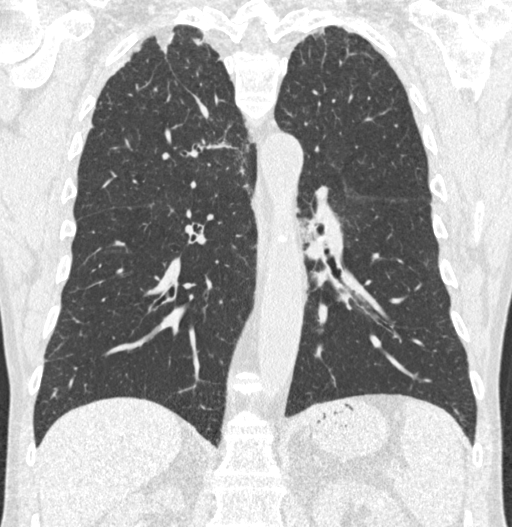

[15 of 40 positions shown; findings below may reference images not displayed]

FINDINGS: Cardiovascular: The heart size is normal. No substantial pericardial
effusion. Coronary artery calcification is evident. Mild
atherosclerotic calcification is noted in the wall of the thoracic
aorta.

Mediastinum/Nodes: No mediastinal lymphadenopathy. No evidence for
gross hilar lymphadenopathy although assessment is limited by the
lack of intravenous contrast on the current study. The esophagus has
normal imaging features. There is no axillary lymphadenopathy.

Lungs/Pleura: Centrilobular emphsyema noted. Biapical
pleuroparenchymal scarring again noted. Scattered tiny bilateral
pulmonary nodules identified previously are stable in the interval
with numerous tiny nodules noted in both lungs. No new suspicious
pulmonary nodule or mass. No focal airspace consolidation. No
pleural effusion.

Upper Abdomen: Unremarkable.

Musculoskeletal: No worrisome lytic or sclerotic osseous
abnormality.
IMPRESSION: 1. Lung-RADS 2, benign appearance or behavior. Continue annual
screening with low-dose chest CT without contrast in 12 months.
2.  Emphysema ([id number]-4GK.Z) and Aortic Atherosclerosis (96KQE-170.0)

## 2022-03-15 ENCOUNTER — Other Ambulatory Visit: Payer: Self-pay | Admitting: *Deleted

## 2022-03-15 DIAGNOSIS — Z8546 Personal history of malignant neoplasm of prostate: Secondary | ICD-10-CM

## 2022-03-16 ENCOUNTER — Other Ambulatory Visit: Payer: Medicare Other

## 2022-03-16 DIAGNOSIS — Z8546 Personal history of malignant neoplasm of prostate: Secondary | ICD-10-CM | POA: Diagnosis not present

## 2022-03-18 LAB — PSA: Prostate Specific Ag, Serum: <0.1 ng/mL (ref 0.0–4.0)

## 2022-03-19 ENCOUNTER — Ambulatory Visit: Payer: Medicare Other | Admitting: Urology

## 2022-03-21 ENCOUNTER — Ambulatory Visit: Payer: Medicare Other | Admitting: Urology

## 2022-04-02 ENCOUNTER — Ambulatory Visit (INDEPENDENT_AMBULATORY_CARE_PROVIDER_SITE_OTHER): Payer: Medicare Other | Admitting: Urology

## 2022-04-02 ENCOUNTER — Encounter: Payer: Self-pay | Admitting: Urology

## 2022-04-02 VITALS — BP 134/79 | HR 93 | Ht 72.0 in | Wt 170.0 lb

## 2022-04-02 DIAGNOSIS — Z8546 Personal history of malignant neoplasm of prostate: Secondary | ICD-10-CM

## 2022-04-02 NOTE — Progress Notes (Signed)
   04/02/2022 10:19 AM   [name] Dec 11, 1950 [id number]  Referring provider: Venita Lick, NP [address] [place],  [place] [address]  Chief Complaint  Patient presents with   Prostate Cancer    Urologic history: 1.  Prostate cancer - RALP 03/2012 at [place]; focal positive margin left mid; undetectable PSA postop  HPI: 71 y.o. male who presents for annual follow-up.  Doing well since last visit No bothersome LUTS Denies dysuria, gross hematuria Denies flank, abdominal or pelvic pain PSA 03/16/2022 remains undetectable at <0.1   PMH: Past Medical History:  Diagnosis Date   Hypertension    Malignant neoplasm prostate ([place])    Polycythemia    Tobacco use     Surgical History: Past Surgical History:  Procedure Laterality Date   FOOT NEUROMA SURGERY Left    PROSTATECTOMY  03/2012    Home Medications:  Allergies as of 04/02/2022   No Known Allergies      Medication List        Accurate as of April 02, 2022 10:19 AM. If you have any questions, ask your nurse or doctor.          aspirin EC 81 MG tablet Take 81 mg by mouth daily.   atorvastatin 20 MG tablet Commonly known as: LIPITOR TAKE 1 TABLET BY MOUTH EVERY DAY   cyanocobalamin 1000 MCG tablet Commonly known as: VITAMIN B12 Take 1 tablet (1,000 mcg total) by mouth daily.   folic acid 1 MG tablet Commonly known as: FOLVITE Take 1 tablet (1 mg total) by mouth daily.   lisinopril 10 MG tablet Commonly known as: ZESTRIL Take 1 tablet (10 mg total) by mouth daily.   metoprolol succinate 25 MG 24 hr tablet Commonly known as: TOPROL-XL Take 1 tablet (25 mg total) by mouth daily.   omeprazole 20 MG capsule Commonly known as: PRILOSEC TAKE 1 CAPSULE BY MOUTH EVERY DAY        Allergies: No Known Allergies  Family History: Family History  Problem Relation Age of Onset   Heart disease Mother        a-fib   Dementia Mother    Stomach cancer Father    Heart disease Brother     Heart disease Brother     Social History:  reports that he has been smoking cigarettes. He has a 46.00 pack-year smoking history. He has never used smokeless tobacco. He reports current alcohol use of about 14.0 standard drinks of alcohol per week. He reports that he does not use drugs.   Physical Exam: BP 134/79   Pulse 93   Ht 6' (1.829 m)   Wt 170 lb (77.1 kg)   BMI 23.06 kg/m   Constitutional:  Alert and oriented, No acute distress. HEENT: [place] AT Respiratory: Normal respiratory effort, no increased work of breathing. Psychiatric: Normal mood and affect.   Assessment & Plan:    1.  Personal history of prostate cancer PSA remains undetectable 10 years postop Recommend continue annual PSA check.  Happy to continue to see him annually or if desired his PCP can check annually and return for any detectable PSA.  He would like to follow-up with his PCP   Abbie Sons, MD  [facility] [address] [place], [place] [address] [phone]

## 2022-06-01 ENCOUNTER — Other Ambulatory Visit: Payer: Self-pay | Admitting: Nurse Practitioner

## 2022-06-01 NOTE — Telephone Encounter (Signed)
Requested Prescriptions  Pending Prescriptions Disp Refills   atorvastatin (LIPITOR) 20 MG tablet [Pharmacy Med Name: ATORVASTATIN 20 MG TABLET] 90 tablet 0    Sig: TAKE 1 TABLET BY MOUTH EVERY DAY     Cardiovascular:  Antilipid - Statins Failed - 06/01/2022  1:47 AM      Failed - Lipid Panel in normal range within the last 12 months    Cholesterol, Total  Date Value Ref Range Status  12/22/2021 175 100 - 199 mg/dL Final   Cholesterol Piccolo, Waived  Date Value Ref Range Status  01/17/2018 173 <200 mg/dL Final    Comment:                            Desirable                <200                         Borderline High      200- 239                         High                     >239    LDL Chol Calc (NIH)  Date Value Ref Range Status  12/22/2021 42 0 - 99 mg/dL Final   HDL  Date Value Ref Range Status  12/22/2021 120 >39 mg/dL Final   Triglycerides  Date Value Ref Range Status  12/22/2021 71 0 - 149 mg/dL Final   Triglycerides Piccolo,Waived  Date Value Ref Range Status  01/17/2018 90 <150 mg/dL Final    Comment:                            Normal                   <150                         Borderline High     150 - 199                         High                200 - 499                         Very High                >499          Passed - Patient is not pregnant      Passed - Valid encounter within last 12 months    Recent Outpatient Visits           5 months ago Centrilobular emphysema ([place])   [place] Cannady, Jolene T, NP   10 months ago Centrilobular emphysema ([place])   [place], Jolene T, NP   11 months ago Centrilobular emphysema ([place])   [place], Henrine Screws T, NP   1 year ago Primary hypertension   [place], Lauren A, NP   2 years ago Centrilobular emphysema ([place])   [place], Barbaraann Faster, NP  Future Appointments             In 3  weeks Cannady, Barbaraann Faster, NP [facility]

## 2022-06-14 ENCOUNTER — Other Ambulatory Visit: Payer: Self-pay | Admitting: Nurse Practitioner

## 2022-06-14 NOTE — Telephone Encounter (Signed)
Refilled 06/13/2021 #[id number] rf. Requested Prescriptions  Pending Prescriptions Disp Refills   metoprolol succinate (TOPROL-XL) 25 MG 24 hr tablet [Pharmacy Med Name: METOPROLOL SUCC ER 25 MG TAB] 90 tablet 4    Sig: TAKE 1 TABLET (25 MG TOTAL) BY MOUTH DAILY.     Cardiovascular:  Beta Blockers Passed - 06/14/2022  2:08 AM      Passed - Last BP in normal range    BP Readings from Last 1 Encounters:  04/02/22 134/79         Passed - Last Heart Rate in normal range    Pulse Readings from Last 1 Encounters:  04/02/22 93         Passed - Valid encounter within last 6 months    Recent Outpatient Visits           5 months ago Centrilobular emphysema ([place])   [place] Cannady, Jolene T, NP   10 months ago Centrilobular emphysema ([place])   [place], Henrine Screws T, NP   1 year ago Centrilobular emphysema ([place])   [place], Barbaraann Faster, NP   1 year ago Primary hypertension   [place], Lauren A, NP   2 years ago Centrilobular emphysema ([place])   [place], Barbaraann Faster, NP       Future Appointments             In 1 week Cannady, Barbaraann Faster, NP [facility]

## 2022-06-24 NOTE — Patient Instructions (Signed)

## 2022-06-26 ENCOUNTER — Encounter: Payer: Self-pay | Admitting: Nurse Practitioner

## 2022-06-26 ENCOUNTER — Ambulatory Visit (INDEPENDENT_AMBULATORY_CARE_PROVIDER_SITE_OTHER): Payer: Medicare Other | Admitting: Nurse Practitioner

## 2022-06-26 VITALS — BP 130/64 | HR 83 | Temp 98.1°F | Ht 72.01 in | Wt 172.9 lb

## 2022-06-26 DIAGNOSIS — D751 Secondary polycythemia: Secondary | ICD-10-CM | POA: Diagnosis not present

## 2022-06-26 DIAGNOSIS — I10 Essential (primary) hypertension: Secondary | ICD-10-CM | POA: Diagnosis not present

## 2022-06-26 DIAGNOSIS — F17210 Nicotine dependence, cigarettes, uncomplicated: Secondary | ICD-10-CM

## 2022-06-26 DIAGNOSIS — F1090 Alcohol use, unspecified, uncomplicated: Secondary | ICD-10-CM

## 2022-06-26 DIAGNOSIS — Z8546 Personal history of malignant neoplasm of prostate: Secondary | ICD-10-CM

## 2022-06-26 DIAGNOSIS — E782 Mixed hyperlipidemia: Secondary | ICD-10-CM

## 2022-06-26 DIAGNOSIS — Z23 Encounter for immunization: Secondary | ICD-10-CM

## 2022-06-26 DIAGNOSIS — I493 Ventricular premature depolarization: Secondary | ICD-10-CM | POA: Diagnosis not present

## 2022-06-26 DIAGNOSIS — Z789 Other specified health status: Secondary | ICD-10-CM | POA: Diagnosis not present

## 2022-06-26 DIAGNOSIS — E538 Deficiency of other specified B group vitamins: Secondary | ICD-10-CM | POA: Diagnosis not present

## 2022-06-26 DIAGNOSIS — I700 Atherosclerosis of aorta: Secondary | ICD-10-CM | POA: Diagnosis not present

## 2022-06-26 DIAGNOSIS — D508 Other iron deficiency anemias: Secondary | ICD-10-CM

## 2022-06-26 DIAGNOSIS — K219 Gastro-esophageal reflux disease without esophagitis: Secondary | ICD-10-CM

## 2022-06-26 DIAGNOSIS — J432 Centrilobular emphysema: Secondary | ICD-10-CM

## 2022-06-26 MED ORDER — LISINOPRIL 10 MG PO TABS: 10.0000 mg | ORAL_TABLET | Freq: Every day | ORAL | 4 refills | Status: DC

## 2022-06-26 MED ORDER — VITAMIN B-12 1000 MCG PO TABS: 1000.0000 ug | ORAL_TABLET | Freq: Every day | ORAL | 4 refills | Status: DC

## 2022-06-26 MED ORDER — ATORVASTATIN CALCIUM 20 MG PO TABS: 20.0000 mg | ORAL_TABLET | Freq: Every day | ORAL | 4 refills | Status: DC

## 2022-06-26 MED ORDER — METOPROLOL SUCCINATE ER 25 MG PO TB24: 25.0000 mg | ORAL_TABLET | Freq: Every day | ORAL | 4 refills | Status: DC

## 2022-06-26 MED ORDER — FOLIC ACID 1 MG PO TABS: 1.0000 mg | ORAL_TABLET | Freq: Every day | ORAL | 4 refills | Status: DC

## 2022-06-26 MED ORDER — OMEPRAZOLE 20 MG PO CPDR: 20.0000 mg | DELAYED_RELEASE_CAPSULE | Freq: Every day | ORAL | 4 refills | Status: DC

## 2022-06-26 NOTE — Assessment & Plan Note (Signed)
Ongoing and stable.  Continues on Metoprolol XL (due to COPD, preference for this BB) 25 MG daily to reduce HR and PVC burden. Heart rate and rhythm regular today. Continue this regimen. Declines cardiology referal. Consider cardiology visit in future if patient agreeable.

## 2022-06-26 NOTE — Assessment & Plan Note (Signed)
Chronic.  Noted on lung CT screening.  Recommend complete cessation of smoking, pt not interested at this time.  Continue daily statin and ASA for prevention.

## 2022-06-26 NOTE — Progress Notes (Signed)
BP 130/64 (BP Location: Left Arm, Patient Position: Sitting, Cuff Size: Normal)   Pulse 83   Temp 98.1 F (36.7 C) (Oral)   Ht 6' 0.01" (1.829 m)   Wt 172 lb 14.4 oz (78.4 kg)   SpO2 99%   BMI 23.44 kg/m    Subjective:    Patient ID: Billy Duncan, male    DOB: Apr 23, 1951, 72 y.o.   MRN: [id number]  HPI: Billy Duncan is a 72 y.o. male  Chief Complaint  Patient presents with   Hypertension   Hyperlipidemia   COPD   folate Deficiency   Gastroesophageal Reflux   B12 Deficiency   HYPERTENSION / HYPERLIPIDEMIA Continues on Metoprolol XL 25 MG daily for HTN and PVCs & Lisinopril + Atorvastatin daily.  History of prostate cancer with removal 10 years ago, has not returned to see urology. Satisfied with current treatment? yes Duration of hypertension: chronic BP monitoring frequency: not checking BP range:  BP medication side effects: no Duration of hyperlipidemia: chronic Cholesterol medication side effects: no Cholesterol supplements: none Medication compliance: good compliance Aspirin: yes Recent stressors: no Recurrent headaches: no Visual changes: no Palpitations: no Dyspnea: no Chest pain: no Lower extremity edema: no Dizzy/lightheaded: no The ASCVD Risk score (Arnett DK, et al., 2019) failed to calculate for the following reasons:   The valid HDL cholesterol range is 20 to 100 mg/dL   ANEMIA Taking Folic acid and S85 for low levels.  Has daily alcohol use.  He reports continuing to drink a couple glasses of wine every night. Anemia status: stable Etiology of anemia: ?alcohol use Duration of anemia treatment: unknow Compliance with treatment: good compliance Iron supplementation side effects: no Severity of anemia: mild Fatigue: no Decreased exercise tolerance: no  Dyspnea on exertion: no Palpitations: no Bleeding: no Pica: no  Flowsheet Row Office Visit from 06/26/2022 in [facility]  Alcohol Use Disorder Identification Test Final  Score (AUDIT) 4      COPD Centrilobular and paraseptal emphysema + aortic atherosclerosis noted on lung cancer screening -- goes for next screening on 07/02/22.  Continues to smoke, smokes < 1 PPD, not interested in quitting at this time. COPD status: stable Satisfied with current treatment?: yes Oxygen use: no Dyspnea frequency: no Cough frequency: no Rescue inhaler frequency:  not using any COPD medications currently Limitation of activity: no Productive cough: no Last Spirometry: 07/25/21 -- FEV1 94% and FEV1/FVC 109% Pneumovax: Up to Date Influenza: Up to Date  Relevant past medical, surgical, family and social history reviewed and updated as indicated. Interim medical history since our last visit reviewed. Allergies and medications reviewed and updated.  Review of Systems  Constitutional:  Negative for activity change, diaphoresis, fatigue and fever.  HENT: Negative.    Eyes: Negative.   Respiratory:  Negative for cough, chest tightness, shortness of breath and wheezing.   Cardiovascular:  Negative for chest pain, palpitations and leg swelling.  Gastrointestinal: Negative.   Neurological:  Negative for dizziness, syncope, weakness, light-headedness, numbness and headaches.  Psychiatric/Behavioral: Negative.      Per HPI unless specifically indicated above     Objective:    BP 130/64 (BP Location: Left Arm, Patient Position: Sitting, Cuff Size: Normal)   Pulse 83   Temp 98.1 F (36.7 C) (Oral)   Ht 6' 0.01" (1.829 m)   Wt 172 lb 14.4 oz (78.4 kg)   SpO2 99%   BMI 23.44 kg/m   Wt Readings from Last 3 Encounters:  06/26/22 172  lb 14.4 oz (78.4 kg)  04/02/22 170 lb (77.1 kg)  12/22/21 162 lb (73.5 kg)    Physical Exam Vitals and nursing note reviewed.  Constitutional:      General: He is awake. He is not in acute distress.    Appearance: He is well-developed, well-groomed and underweight. He is not ill-appearing or toxic-appearing.  HENT:     Head: Normocephalic  and atraumatic.     Right Ear: Hearing normal. No drainage.     Left Ear: Hearing normal. No drainage.  Eyes:     General: Lids are normal.        Right eye: No discharge.        Left eye: No discharge.     Conjunctiva/sclera: Conjunctivae normal.     Pupils: Pupils are equal, round, and reactive to light.  Neck:     Vascular: No carotid bruit.  Cardiovascular:     Rate and Rhythm: Normal rate and regular rhythm.     Heart sounds: Normal heart sounds, S1 normal and S2 normal. No murmur heard.    No gallop.  Pulmonary:     Effort: Pulmonary effort is normal. No accessory muscle usage or respiratory distress.     Breath sounds: Normal breath sounds. No wheezing or rhonchi.  Abdominal:     General: Abdomen is flat. Bowel sounds are normal.     Palpations: Abdomen is soft.  Musculoskeletal:        General: Normal range of motion.     Cervical back: Normal range of motion and neck supple.     Right lower leg: No edema.     Left lower leg: No edema.  Skin:    General: Skin is warm and dry.     Capillary Refill: Capillary refill takes less than 2 seconds.  Neurological:     Mental Status: He is alert and oriented to person, place, and time.  Psychiatric:        Attention and Perception: Attention normal.        Mood and Affect: Mood normal.        Speech: Speech normal.        Behavior: Behavior normal. Behavior is cooperative.        Thought Content: Thought content normal.    Results for orders placed or performed in visit on 03/16/22  PSA  Result Value Ref Range   Prostate Specific Ag, Serum <0.1 0.0 - 4.0 ng/mL      Assessment & Plan:   Problem List Items Addressed This Visit       Cardiovascular and Mediastinum   Atherosclerosis of aorta (HCC)    Chronic.  Noted on lung CT screening.  Recommend complete cessation of smoking, pt not interested at this time.  Continue daily statin and ASA for prevention.      Relevant Medications   atorvastatin (LIPITOR) 20 MG  tablet   lisinopril (ZESTRIL) 10 MG tablet   metoprolol succinate (TOPROL-XL) 25 MG 24 hr tablet   Frequent PVCs    Ongoing and stable.  Continues on Metoprolol XL (due to COPD, preference for this BB) 25 MG daily to reduce HR and PVC burden. Heart rate and rhythm regular today. Continue this regimen. Declines cardiology referal. Consider cardiology visit in future if patient agreeable.      Relevant Medications   atorvastatin (LIPITOR) 20 MG tablet   lisinopril (ZESTRIL) 10 MG tablet   metoprolol succinate (TOPROL-XL) 25 MG 24 hr tablet   Hypertension  Chronic, ongoing.  BP at goal in office today.  Recommend he monitor BP at least a few mornings a week at home and document.  DASH diet at home.  Continue current medication regimen and adjust as needed.  Labs today: CBC, CMP, TSH.  Return in 6 months.       Relevant Medications   atorvastatin (LIPITOR) 20 MG tablet   lisinopril (ZESTRIL) 10 MG tablet   metoprolol succinate (TOPROL-XL) 25 MG 24 hr tablet   Other Relevant Orders   Comprehensive metabolic panel   TSH     Respiratory   Centrilobular emphysema (HCC) - Primary    Chronic, stable.  Noted on CT scan screening. Denies shortness of breath or cough. Not on any inhalers at this time. Spirometry done in office 2023 and will continue yearly screening.  Recommend complete cessation of smoking.        Digestive   GERD (gastroesophageal reflux disease)    Chronic, ongoing.  Continue current medication regimen and adjust as needed.  Mag level today.  Risks of PPI use were discussed with patient including bone loss, C. Diff diarrhea, pneumonia, infections, CKD, electrolyte abnormalities.  Verbalizes understanding and chooses to continue the medication.       Relevant Medications   omeprazole (PRILOSEC) 20 MG capsule   Other Relevant Orders   Magnesium     Other   Alcohol use    Patient does endorse daily alcohol use -- AUDIT = 4 on check.  Recommend cutting back.       Relevant Orders   Comprehensive metabolic panel   I14 deficiency    Chronic and ongoing.  Continue daily supplement and recheck B12 level next visit.      Relevant Orders   Iron Binding Cap (TIBC)([facility])   Ferritin   Folic acid deficiency    Chronic and ongoing.  Labs today. Continue folate and highly recommend he take B12. Patient states he will take them.      Mixed hyperlipidemia    Chronic, ongoing.  Continue current medication regimen and adjust as needed.  Lipid panel today.      Relevant Medications   atorvastatin (LIPITOR) 20 MG tablet   lisinopril (ZESTRIL) 10 MG tablet   metoprolol succinate (TOPROL-XL) 25 MG 24 hr tablet   Other Relevant Orders   Comprehensive metabolic panel   Lipid Panel w/o Chol/HDL Ratio   Nicotine dependence, cigarettes, uncomplicated    I have recommended complete cessation of tobacco use. I have discussed various options available for assistance with tobacco cessation including over the counter methods (Nicotine gum, patch and lozenges). We also discussed prescription options (Chantix, Nicotine Inhaler / Nasal Spray). The patient is not interested in pursuing any prescription tobacco cessation options at this time.       Personal history of prostate cancer    Not seeing urology, check PSA today.      Relevant Orders   PSA   Polycythemia    Chronic, stable on labs.  CBC and anemia panel ordered today.      Relevant Orders   CBC with Differential/Platelet   Iron Binding Cap (TIBC)([facility])   Ferritin   Other Visit Diagnoses     Other iron deficiency anemia       Relevant Medications   folic acid (FOLVITE) 1 MG tablet   cyanocobalamin (VITAMIN B12) 1000 MCG tablet   Other Relevant Orders   Iron Binding Cap (TIBC)([facility])   Ferritin   Flu vaccine need  Flu vaccine in office today.   Relevant Orders   Flu Vaccine QUAD High Dose(Fluad) (Completed)        Follow up plan: Return in about 6  months (around 12/25/2022) for COPD, HTN/HLD, ANEMIA, Alcohol use -- with spirometry.

## 2022-06-26 NOTE — Assessment & Plan Note (Signed)
Patient does endorse daily alcohol use -- AUDIT = 4 on check.  Recommend cutting back.

## 2022-06-26 NOTE — Assessment & Plan Note (Signed)
Chronic and ongoing.  Continue daily supplement and recheck B12 level next visit.

## 2022-06-26 NOTE — Assessment & Plan Note (Signed)
Chronic, ongoing.  Continue current medication regimen and adjust as needed. Lipid panel today. 

## 2022-06-26 NOTE — Assessment & Plan Note (Signed)
Chronic, ongoing.  BP at goal in office today.  Recommend he monitor BP at least a few mornings a week at home and document.  DASH diet at home.  Continue current medication regimen and adjust as needed.  Labs today: CBC, CMP, TSH.  Return in 6 months.

## 2022-06-26 NOTE — Assessment & Plan Note (Signed)
I have recommended complete cessation of tobacco use. I have discussed various options available for assistance with tobacco cessation including over the counter methods (Nicotine gum, patch and lozenges). We also discussed prescription options (Chantix, Nicotine Inhaler / Nasal Spray). The patient is not interested in pursuing any prescription tobacco cessation options at this time.  

## 2022-06-26 NOTE — Assessment & Plan Note (Signed)
Chronic and ongoing.  Labs today. Continue folate and highly recommend he take B12. Patient states he will take them.

## 2022-06-26 NOTE — Assessment & Plan Note (Signed)
Not seeing urology, check PSA today.

## 2022-06-26 NOTE — Assessment & Plan Note (Signed)
Chronic, ongoing.  Continue current medication regimen and adjust as needed.  Mag level today.  Risks of PPI use were discussed with patient including bone loss, C. Diff diarrhea, pneumonia, infections, CKD, electrolyte abnormalities.  Verbalizes understanding and chooses to continue the medication.

## 2022-06-26 NOTE — Assessment & Plan Note (Signed)
Chronic, stable.  Noted on CT scan screening. Denies shortness of breath or cough. Not on any inhalers at this time. Spirometry done in office 2023 and will continue yearly screening.  Recommend complete cessation of smoking.

## 2022-06-26 NOTE — Assessment & Plan Note (Signed)
Chronic, stable on labs.  CBC and anemia panel ordered today.

## 2022-06-27 ENCOUNTER — Encounter: Payer: Self-pay | Admitting: Nurse Practitioner

## 2022-06-27 LAB — COMPREHENSIVE METABOLIC PANEL
ALT: 17 IU/L (ref 0–44)
AST: 24 IU/L (ref 0–40)
Albumin/Globulin Ratio: 2 (ref 1.2–2.2)
Albumin: 4.7 g/dL (ref 3.9–4.9)
Alkaline Phosphatase: 74 IU/L (ref 44–121)
BUN/Creatinine Ratio: 8 — ABNORMAL LOW (ref 10–24)
BUN: 9 mg/dL (ref 8–27)
Bilirubin Total: 1.1 mg/dL (ref 0.0–1.2)
CO2: 23 mmol/L (ref 20–29)
Calcium: 9.9 mg/dL (ref 8.6–10.2)
Chloride: 97 mmol/L (ref 96–106)
Creatinine, Ser: 1.19 mg/dL (ref 0.76–1.27)
Globulin, Total: 2.3 g/dL (ref 1.5–4.5)
Glucose: 92 mg/dL (ref 70–99)
Potassium: 4.7 mmol/L (ref 3.5–5.2)
Sodium: 135 mmol/L (ref 134–144)
Total Protein: 7 g/dL (ref 6.0–8.5)
eGFR: 66 mL/min/{1.73_m2} (ref >59)

## 2022-06-27 LAB — CBC WITH DIFFERENTIAL/PLATELET
Basophils Absolute: 0 10*3/uL (ref 0.0–0.2)
Basos: 1 %
EOS (ABSOLUTE): 0.1 10*3/uL (ref 0.0–0.4)
Eos: 3 %
Hematocrit: 38.9 % (ref 37.5–51.0)
Hemoglobin: 13.3 g/dL (ref 13.0–17.7)
Immature Grans (Abs): 0 10*3/uL (ref 0.0–0.1)
Immature Granulocytes: 0 %
Lymphocytes Absolute: 1.5 10*3/uL (ref 0.7–3.1)
Lymphs: 33 %
MCH: 32.4 pg (ref 26.6–33.0)
MCHC: 34.2 g/dL (ref 31.5–35.7)
MCV: 95 fL (ref 79–97)
Monocytes Absolute: 0.4 10*3/uL (ref 0.1–0.9)
Monocytes: 10 %
Neutrophils Absolute: 2.4 10*3/uL (ref 1.4–7.0)
Neutrophils: 53 %
Platelets: 195 10*3/uL (ref 150–450)
RBC: 4.11 x10E6/uL — ABNORMAL LOW (ref 4.14–5.80)
RDW: 12.3 % (ref 11.6–15.4)
WBC: 4.5 10*3/uL (ref 3.4–10.8)

## 2022-06-27 LAB — IRON AND TIBC
Iron Saturation: 46 % (ref 15–55)
Iron: 145 ug/dL (ref 38–169)
Total Iron Binding Capacity: 313 ug/dL (ref 250–450)
UIBC: 168 ug/dL (ref 111–343)

## 2022-06-27 LAB — TSH: TSH: 2.57 u[IU]/mL (ref 0.450–4.500)

## 2022-06-27 LAB — MAGNESIUM: Magnesium: 1.6 mg/dL (ref 1.6–2.3)

## 2022-06-27 LAB — PSA: Prostate Specific Ag, Serum: <0.1 ng/mL (ref 0.0–4.0)

## 2022-06-27 LAB — LIPID PANEL W/O CHOL/HDL RATIO
Cholesterol, Total: 191 mg/dL (ref 100–199)
HDL: 122 mg/dL (ref >39)
LDL Chol Calc (NIH): 58 mg/dL (ref 0–99)
Triglycerides: 55 mg/dL (ref 0–149)
VLDL Cholesterol Cal: 11 mg/dL (ref 5–40)

## 2022-06-27 LAB — FERRITIN: Ferritin: 289 ng/mL (ref 30–400)

## 2022-06-27 NOTE — Progress Notes (Signed)
Please send letter printed and on tray.:)

## 2022-07-02 ENCOUNTER — Ambulatory Visit
Admission: RE | Admit: 2022-07-02 | Discharge: 2022-07-02 | Disposition: A | Discharge status: Home / Self Care | Payer: Medicare Other | Source: Ambulatory Visit | Attending: Acute Care | Admitting: Acute Care

## 2022-07-02 DIAGNOSIS — J439 Emphysema, unspecified: Secondary | ICD-10-CM | POA: Diagnosis not present

## 2022-07-02 DIAGNOSIS — Z122 Encounter for screening for malignant neoplasm of respiratory organs: Secondary | ICD-10-CM | POA: Insufficient documentation

## 2022-07-02 DIAGNOSIS — I700 Atherosclerosis of aorta: Secondary | ICD-10-CM | POA: Insufficient documentation

## 2022-07-02 DIAGNOSIS — Z87891 Personal history of nicotine dependence: Secondary | ICD-10-CM

## 2022-07-02 DIAGNOSIS — I2510 Atherosclerotic heart disease of native coronary artery without angina pectoris: Secondary | ICD-10-CM | POA: Insufficient documentation

## 2022-07-02 DIAGNOSIS — F17210 Nicotine dependence, cigarettes, uncomplicated: Secondary | ICD-10-CM | POA: Insufficient documentation

## 2022-07-04 ENCOUNTER — Other Ambulatory Visit: Payer: Self-pay

## 2022-07-04 DIAGNOSIS — Z87891 Personal history of nicotine dependence: Secondary | ICD-10-CM

## 2022-07-04 DIAGNOSIS — F17210 Nicotine dependence, cigarettes, uncomplicated: Secondary | ICD-10-CM

## 2022-07-30 ENCOUNTER — Telehealth: Payer: Self-pay | Admitting: Nurse Practitioner

## 2022-07-30 NOTE — Telephone Encounter (Signed)
Copied from [place] [id number]. Topic: Medicare AWV >> Jul 30, 2022  2:41 PM Devoria Glassing wrote: Reason for CRM: Left message for patient to schedule Annual Wellness Visit.  Please schedule with Health Nurse Advisor Kirke Shaggy at [facility].Call [place] at [phone]

## 2022-09-11 ENCOUNTER — Telehealth: Payer: Self-pay | Admitting: Nurse Practitioner

## 2022-09-11 NOTE — Telephone Encounter (Signed)
[place] to schedule their annual wellness visit. Appointment made for 09/07/2022.  Billy Duncan; Care Guide Ambulatory Clinical [facility] Direct Dial: [phone]

## 2022-10-08 ENCOUNTER — Ambulatory Visit (INDEPENDENT_AMBULATORY_CARE_PROVIDER_SITE_OTHER): Payer: Medicare Other

## 2022-10-08 VITALS — Ht 72.0 in | Wt 172.0 lb

## 2022-10-08 DIAGNOSIS — Z0000 Encounter for general adult medical examination without abnormal findings: Secondary | ICD-10-CM

## 2022-10-08 NOTE — Progress Notes (Signed)
I connected with  Billy Duncan on 10/08/22 by a audio enabled telemedicine application and verified that I am speaking with the correct person using two identifiers.  Patient Location: Home  Provider Location: Office/Clinic  I discussed the limitations of evaluation and management by telemedicine. The patient expressed understanding and agreed to proceed.  Subjective:   Billy Duncan is a 72 y.o. male who presents for Medicare Annual/Subsequent preventive examination.  Review of Systems     Cardiac Risk Factors include: advanced age (>22men, >70 women);smoking/ tobacco exposure;hypertension;male gender     Objective:    There were no vitals filed for this visit. There is no height or weight on file to calculate BMI.     10/08/2022    1:11 PM 05/13/2020    8:21 AM 05/13/2019    1:44 PM 02/20/2018   10:21 AM  Advanced Directives  Does Patient Have a Medical Advance Directive? No No No No  Would patient like information on creating a medical advance directive? No - Patient declined   No - Patient declined    Current Medications (verified) Outpatient Encounter Medications as of 10/08/2022  Medication Sig   aspirin EC 81 MG tablet Take 81 mg by mouth daily.   atorvastatin (LIPITOR) 20 MG tablet Take 1 tablet (20 mg total) by mouth daily.   cyanocobalamin (VITAMIN B12) 1000 MCG tablet Take 1 tablet (1,000 mcg total) by mouth daily.   folic acid (FOLVITE) 1 MG tablet Take 1 tablet (1 mg total) by mouth daily.   lisinopril (ZESTRIL) 10 MG tablet Take 1 tablet (10 mg total) by mouth daily.   metoprolol succinate (TOPROL-XL) 25 MG 24 hr tablet Take 1 tablet (25 mg total) by mouth daily.   omeprazole (PRILOSEC) 20 MG capsule Take 1 capsule (20 mg total) by mouth daily.   No facility-administered encounter medications on file as of 10/08/2022.    Allergies (verified) Patient has no known allergies.   History: Past Medical History:  Diagnosis Date   Hypertension     Malignant neoplasm prostate    Polycythemia    Tobacco use    Past Surgical History:  Procedure Laterality Date   FOOT NEUROMA SURGERY Left    PROSTATECTOMY  03/2012   Family History  Problem Relation Age of Onset   Heart disease Mother        a-fib   Dementia Mother    Stomach cancer Father    Heart disease Brother    Heart disease Brother    Social History   Socioeconomic History   Marital status: Divorced    Spouse name: Not on file   Number of children: 2   Years of education: Not on file   Highest education level: Some college, no degree  Occupational History   Not on file  Tobacco Use   Smoking status: Every Day    Packs/day: 1.00    Years: 46.00    Additional pack years: 0.00    Total pack years: 46.00    Types: Cigarettes   Smokeless tobacco: Never  Vaping Use   Vaping Use: Never used  Substance and Sexual Activity   Alcohol use: Yes    Alcohol/week: 14.0 standard drinks of alcohol    Types: 14 Glasses of wine per week   Drug use: No   Sexual activity: Not Currently    Comment: pt states very seldem  Other Topics Concern   Not on file  Social History Narrative   Not on file  Social Determinants of Health   Financial Resource Strain: Low Risk  (10/08/2022)   Overall Financial Resource Strain (CARDIA)    Difficulty of Paying Living Expenses: Not hard at all  Food Insecurity: No Food Insecurity (10/08/2022)   Hunger Vital Sign    Worried About Running Out of Food in the Last Year: Never true    Ran Out of Food in the Last Year: Never true  Transportation Needs: No Transportation Needs (10/08/2022)   PRAPARE - Administrator, Civil Service (Medical): No    Lack of Transportation (Non-Medical): No  Physical Activity: Insufficiently Active (10/08/2022)   Exercise Vital Sign    Days of Exercise per Week: 3 days    Minutes of Exercise per Session: 30 min  Stress: No Stress Concern Present (10/08/2022)   [employer] of Occupational  Health - Occupational Stress Questionnaire    Feeling of Stress : Not at all  Social Connections: Socially Isolated (10/08/2022)   Social Connection and Isolation Panel [NHANES]    Frequency of Communication with Friends and Family: More than three times a week    Frequency of Social Gatherings with Friends and Family: Twice a week    Attends Religious Services: Never    Database administrator or Organizations: No    Attends Engineer, structural: Never    Marital Status: Divorced    Tobacco Counseling Ready to quit: Not Answered Counseling given: Not Answered   Clinical Intake:  Pre-visit preparation completed: Yes  Pain : No/denies pain     Nutritional Risks: None Diabetes: No  How often do you need to have someone help you when you read instructions, pamphlets, or other written materials from your doctor or pharmacy?: 1 - Never  Diabetic?no  Interpreter Needed?: No  Information entered by :: Kennedy Bucker, LPN   Activities of Daily Living    10/08/2022    1:11 PM  In your present state of health, do you have any difficulty performing the following activities:  Hearing? 0  Vision? 0  Difficulty concentrating or making decisions? 0  Walking or climbing stairs? 0  Dressing or bathing? 0  Doing errands, shopping? 0  Preparing Food and eating ? N  Using the Toilet? N  In the past six months, have you accidently leaked urine? N  Do you have problems with loss of bowel control? N  Managing your Medications? N  Managing your Finances? N  Housekeeping or managing your Housekeeping? N    Patient Care Team: Marjie Skiff, NP as PCP - General (Nurse Practitioner) Riki Altes, MD (Urology)  Indicate any recent Medical Services you may have received from other than [facility] providers in the past year (date may be approximate).     Assessment:   This is a routine wellness examination for Billy Duncan.  Hearing/Vision screen Hearing Screening - Comments::  No aids Vision Screening - Comments:: Wears glasses- [facility]  Dietary issues and exercise activities discussed: Current Exercise Habits: Home exercise routine, Type of exercise: walking, Time (Minutes): 30, Frequency (Times/Week): 3, Weekly Exercise (Minutes/Week): 90, Intensity: Mild, Exercise limited by: None identified   Goals Addressed             This Visit's Progress    DIET - EAT MORE FRUITS AND VEGETABLES         Depression Screen    10/08/2022    1:09 PM 12/22/2021   11:08 AM 07/25/2021    1:24 PM 07/24/2021  1:02 PM 06/13/2021   10:14 AM 05/13/2020    8:22 AM 05/13/2019    1:44 PM  PHQ 2/9 Scores  PHQ - 2 Score 0 0 0 0 0 0 0  PHQ- 9 Score 0 0 0 0 0      Fall Risk    12/22/2021   11:08 AM 07/25/2021    1:24 PM 07/24/2021   12:51 PM 05/13/2020    8:22 AM 05/13/2019    1:39 PM  Fall Risk   Falls in the past year? 0 0 0 0 0  Number falls in past yr: 0 0 0  0  Injury with Fall? 0 0 0  0  Risk for fall due to : No Fall Risks No Fall Risks  Medication side effect   Follow up Falls evaluation completed Falls evaluation completed Falls evaluation completed;Falls prevention discussed Falls evaluation completed;Education provided;Falls prevention discussed     FALL RISK PREVENTION PERTAINING TO THE HOME:  Any stairs in or around the home? Yes  If so, are there any without handrails? No  Home free of loose throw rugs in walkways, pet beds, electrical cords, etc? Yes  Adequate lighting in your home to reduce risk of falls? Yes   ASSISTIVE DEVICES UTILIZED TO PREVENT FALLS:  Life alert? No  Use of a cane, walker or w/c? No  Grab bars in the bathroom? Yes  Shower chair or bench in shower? No  Elevated toilet seat or a handicapped toilet? No   Cognitive Function:        10/08/2022    1:15 PM 05/13/2020    8:24 AM 02/20/2018   10:21 AM  6CIT Screen  What Year? 0 points 0 points 0 points  What month? 0 points 0 points 0 points  What time? 0 points 0  points 0 points  Count back from 20 0 points 0 points 0 points  Months in reverse 0 points 2 points 0 points  Repeat phrase 0 points 2 points 0 points  Total Score 0 points 4 points 0 points    Immunizations Immunization History  Administered Date(s) Administered   Fluad Quad(high Dose 65+) 06/26/2022   Influenza, High Dose Seasonal PF 02/20/2018   Influenza,inj,Quad PF,6+ Mos 04/30/2016   PFIZER(Purple Top)SARS-COV-2 Vaccination 08/24/2019, 09/14/2019, 06/12/2020   Pneumococcal Conjugate-13 10/18/2017   Pneumococcal Polysaccharide-23 01/05/2013, 12/12/2020   Td 12/22/2021   Tdap 03/10/2009   Zoster, Live 01/05/2013    TDAP status: Up to date  Flu Vaccine status: Up to date  Pneumococcal vaccine status: Up to date  Covid-19 vaccine status: Completed vaccines  Qualifies for Shingles Vaccine? Yes   Zostavax completed Yes   Shingrix Completed?: No.    Education has been provided regarding the importance of this vaccine. Patient has been advised to call insurance company to determine out of pocket expense if they have not yet received this vaccine. Advised may also receive vaccine at local pharmacy or Health Dept. Verbalized acceptance and understanding.  Screening Tests Health Maintenance  Topic Date Due   Zoster Vaccines- Shingrix (1 of 2) Never done   COVID-19 Vaccine (4 - 2023-24 season) 02/23/2022   INFLUENZA VACCINE  01/24/2023   Lung Cancer Screening  07/03/2023   Medicare Annual Wellness (AWV)  10/08/2023   COLONOSCOPY (Pts 45-53yrs Insurance coverage will need to be confirmed)  09/12/2024   DTaP/Tdap/Td (3 - Td or Tdap) 12/23/2031   Pneumonia Vaccine 72+ Years old  Completed   Hepatitis C Screening  Completed  HPV VACCINES  Aged Out    Health Maintenance  Health Maintenance Due  Topic Date Due   Zoster Vaccines- Shingrix (1 of 2) Never done   COVID-19 Vaccine (4 - 2023-24 season) 02/23/2022    Colorectal cancer screening: Type of screening: Colonoscopy.  Completed 09/13/14. Repeat every 10 years  Lung Cancer Screening: (Low Dose CT Chest recommended if Age 53-80 years, 30 pack-year currently smoking OR have quit w/in 15years.) does qualify.   Lung Cancer Screening Referral: 07/02/22- ordered on 07/04/22  Additional Screening:  Hepatitis C Screening: does qualify; Completed 10/26/15  Vision Screening: Recommended annual ophthalmology exams for early detection of glaucoma and other disorders of the eye. Is the patient up to date with their annual eye exam?  Yes  Who is the provider or what is the name of the office in which the patient attends annual eye exams? [facility]  If pt is not established with a provider, would they like to be referred to a provider to establish care? No .   Dental Screening: Recommended annual dental exams for proper oral hygiene  Community Resource Referral / Chronic Care Management: CRR required this visit?  No   CCM required this visit?  No      Plan:     I have personally reviewed and noted the following in the patient's chart:   Medical and social history Use of alcohol, tobacco or illicit drugs  Current medications and supplements including opioid prescriptions. Patient is not currently taking opioid prescriptions. Functional ability and status Nutritional status Physical activity Advanced directives List of other physicians Hospitalizations, surgeries, and ER visits in previous 12 months Vitals Screenings to include cognitive, depression, and falls Referrals and appointments  In addition, I have reviewed and discussed with patient certain preventive protocols, quality metrics, and best practice recommendations. A written personalized care plan for preventive services as well as general preventive health recommendations were provided to patient.     Hal Hope, LPN   7/89/3810   Nurse Notes: none

## 2022-10-08 NOTE — Patient Instructions (Signed)
Billy Duncan , Thank you for taking time to come for your Medicare Wellness Visit. I appreciate your ongoing commitment to your health goals. Please review the following plan we discussed and let me know if I can assist you in the future.   These are the goals we discussed:  Goals      DIET - EAT MORE FRUITS AND VEGETABLES     Patient Stated     05/13/2020, no goals     Quit smoking / using tobacco        This is a list of the screening recommended for you and due dates:  Health Maintenance  Topic Date Due   Zoster (Shingles) Vaccine (1 of 2) Never done   COVID-19 Vaccine (4 - 2023-24 season) 02/23/2022   Flu Shot  01/24/2023   Screening for Lung Cancer  07/03/2023   Medicare Annual Wellness Visit  10/08/2023   Colon Cancer Screening  09/12/2024   DTaP/Tdap/Td vaccine (3 - Td or Tdap) 12/23/2031   Pneumonia Vaccine  Completed   Hepatitis C Screening: USPSTF Recommendation to screen - Ages 82-79 yo.  Completed   HPV Vaccine  Aged Out    Advanced directives: no  Conditions/risks identified: none  Next appointment: Follow up in one year for your annual wellness visit. 4/21/252 10:45 am by phone  Preventive Care 65 Years and Older, Male  Preventive care refers to lifestyle choices and visits with your health care provider that can promote health and wellness. What does preventive care include? A yearly physical exam. This is also called an annual well check. Dental exams once or twice a year. Routine eye exams. Ask your health care provider how often you should have your eyes checked. Personal lifestyle choices, including: Daily care of your teeth and gums. Regular physical activity. Eating a healthy diet. Avoiding tobacco and drug use. Limiting alcohol use. Practicing safe sex. Taking low doses of aspirin every day. Taking vitamin and mineral supplements as recommended by your health care provider. What happens during an annual well check? The services and screenings  done by your health care provider during your annual well check will depend on your age, overall health, lifestyle risk factors, and family history of disease. Counseling  Your health care provider may ask you questions about your: Alcohol use. Tobacco use. Drug use. Emotional well-being. Home and relationship well-being. Sexual activity. Eating habits. History of falls. Memory and ability to understand (cognition). Work and work Astronomer. Screening  You may have the following tests or measurements: Height, weight, and BMI. Blood pressure. Lipid and cholesterol levels. These may be checked every 5 years, or more frequently if you are over 33 years old. Skin check. Lung cancer screening. You may have this screening every year starting at age 73 if you have a 30-pack-year history of smoking and currently smoke or have quit within the past 15 years. Fecal occult blood test (FOBT) of the stool. You may have this test every year starting at age 53. Flexible sigmoidoscopy or colonoscopy. You may have a sigmoidoscopy every 5 years or a colonoscopy every 10 years starting at age 37. Prostate cancer screening. Recommendations will vary depending on your family history and other risks. Hepatitis C blood test. Hepatitis B blood test. Sexually transmitted disease (STD) testing. Diabetes screening. This is done by checking your blood sugar (glucose) after you have not eaten for a while (fasting). You may have this done every 1-3 years. Abdominal aortic aneurysm (AAA) screening. You may need this  if you are a current or former smoker. Osteoporosis. You may be screened starting at age 52 if you are at high risk. Talk with your health care provider about your test results, treatment options, and if necessary, the need for more tests. Vaccines  Your health care provider may recommend certain vaccines, such as: Influenza vaccine. This is recommended every year. Tetanus, diphtheria, and acellular  pertussis (Tdap, Td) vaccine. You may need a Td booster every 10 years. Zoster vaccine. You may need this after age 58. Pneumococcal 13-valent conjugate (PCV13) vaccine. One dose is recommended after age 97. Pneumococcal polysaccharide (PPSV23) vaccine. One dose is recommended after age 76. Talk to your health care provider about which screenings and vaccines you need and how often you need them. This information is not intended to replace advice given to you by your health care provider. Make sure you discuss any questions you have with your health care provider. Document Released: 07/08/2015 Document Revised: 02/29/2016 Document Reviewed: 04/12/2015 Elsevier Interactive Patient Education  2017 [employer] Prevention in the Home Falls can cause injuries. They can happen to people of all ages. There are many things you can do to make your home safe and to help prevent falls. What can I do on the outside of my home? Regularly fix the edges of walkways and driveways and fix any cracks. Remove anything that might make you trip as you walk through a door, such as a raised step or threshold. Trim any bushes or trees on the path to your home. Use bright outdoor lighting. Clear any walking paths of anything that might make someone trip, such as rocks or tools. Regularly check to see if handrails are loose or broken. Make sure that both sides of any steps have handrails. Any raised decks and porches should have guardrails on the edges. Have any leaves, snow, or ice cleared regularly. Use sand or salt on walking paths during winter. Clean up any spills in your garage right away. This includes oil or grease spills. What can I do in the bathroom? Use night lights. Install grab bars by the toilet and in the tub and shower. Do not use towel bars as grab bars. Use non-skid mats or decals in the tub or shower. If you need to sit down in the shower, use a plastic, non-slip stool. Keep the floor  dry. Clean up any water that spills on the floor as soon as it happens. Remove soap buildup in the tub or shower regularly. Attach bath mats securely with double-sided non-slip rug tape. Do not have throw rugs and other things on the floor that can make you trip. What can I do in the bedroom? Use night lights. Make sure that you have a light by your bed that is easy to reach. Do not use any sheets or blankets that are too big for your bed. They should not hang down onto the floor. Have a firm chair that has side arms. You can use this for support while you get dressed. Do not have throw rugs and other things on the floor that can make you trip. What can I do in the kitchen? Clean up any spills right away. Avoid walking on wet floors. Keep items that you use a lot in easy-to-reach places. If you need to reach something above you, use a strong step stool that has a grab bar. Keep electrical cords out of the way. Do not use floor polish or wax that makes floors slippery.  If you must use wax, use non-skid floor wax. Do not have throw rugs and other things on the floor that can make you trip. What can I do with my stairs? Do not leave any items on the stairs. Make sure that there are handrails on both sides of the stairs and use them. Fix handrails that are broken or loose. Make sure that handrails are as long as the stairways. Check any carpeting to make sure that it is firmly attached to the stairs. Fix any carpet that is loose or worn. Avoid having throw rugs at the top or bottom of the stairs. If you do have throw rugs, attach them to the floor with carpet tape. Make sure that you have a light switch at the top of the stairs and the bottom of the stairs. If you do not have them, ask someone to add them for you. What else can I do to help prevent falls? Wear shoes that: Do not have high heels. Have rubber bottoms. Are comfortable and fit you well. Are closed at the toe. Do not wear  sandals. If you use a stepladder: Make sure that it is fully opened. Do not climb a closed stepladder. Make sure that both sides of the stepladder are locked into place. Ask someone to hold it for you, if possible. Clearly Shellie and make sure that you can see: Any grab bars or handrails. First and last steps. Where the edge of each step is. Use tools that help you move around (mobility aids) if they are needed. These include: Canes. Walkers. Scooters. Crutches. Turn on the lights when you go into a dark area. Replace any light bulbs as soon as they burn out. Set up your furniture so you have a clear path. Avoid moving your furniture around. If any of your floors are uneven, fix them. If there are any pets around you, be aware of where they are. Review your medicines with your doctor. Some medicines can make you feel dizzy. This can increase your chance of falling. Ask your doctor what other things that you can do to help prevent falls. This information is not intended to replace advice given to you by your health care provider. Make sure you discuss any questions you have with your health care provider. Document Released: 04/07/2009 Document Revised: 11/17/2015 Document Reviewed: 07/16/2014 Elsevier Interactive Patient Education  2017 [employer].

## 2022-12-22 NOTE — Patient Instructions (Signed)
Eating Plan for Chronic Obstructive Pulmonary Disease Chronic obstructive pulmonary disease (COPD) causes symptoms such as shortness of breath, coughing, and chest discomfort. These symptoms can make it difficult to eat enough to maintain a healthy weight. Generally, people with COPD should eat a diet that is high in calories, protein, and other nutrients to maintain body weight and to keep the lungs as healthy as possible. Depending on the medicines you take and other health conditions you may have, your health care provider may give you additional recommendations on what to eat or avoid. Talk with your health care provider about your goals for body weight, and work with a dietitian to develop an eating plan that is right for you. What are tips for following this plan? Reading food labels  Avoid foods with more than 300 milligrams (mg) of salt (sodium) per serving. Choose foods that contain at least 4 grams (g) of fiber per serving. Try to eat 20-30 g of fiber each day. Choose foods that are high in calories and protein, such as nuts, beans, yogurt, and cheese. Shopping Do not buy foods labeled as diet, low-calorie, or low-fat. If you are able to eat dairy products: Avoid low-fat or skim milk. Buy dairy products that have at least 2% fat. Buy nutritional supplement drinks. Buy grains and prepared foods labeled as enriched or fortified. Consider buying low-sodium, pre-made foods to conserve energy for eating. Cooking Add dry milk or protein powder to smoothies. Cook with healthy fats, such as olive oil, canola oil, sunflower oil, and grapeseed oil. Add oil, butter, cream cheese, or nut butters to foods to increase fat and calories. To make foods easier to chew and swallow: Cook vegetables, pasta, and rice until soft. Cut or grind meat into very small pieces. Dip breads in liquid. Meal planning  Eat when you feel hungry. Eat 5-6 small meals throughout the day. Drink 6-8 glasses of water  each day. Do not drink liquids with meals. Drink liquids at the end of the meal to avoid feeling full too quickly. Eat a variety of fruits and vegetables every day. Ask for assistance from family or friends with planning and preparing meals as needed. Avoid foods that cause you to feel bloated, such as carbonated drinks, fried foods, beans, broccoli, cabbage, and apples. For older adults, ask your local agency on aging whether you are eligible for meal assistance programs, such as Meals on Wheels. Lifestyle  Do not smoke. Eat slowly. Take small bites and chew food well before swallowing. Do not overeat. This may make it more difficult to breathe after eating. Sit up while eating. If needed, continue to use supplemental oxygen while eating. Rest or relax for 30 minutes before and after eating. Monitor your weight as told by your health care provider. Exercise as told by your health care provider. What foods should I eat? Fruits All fresh, dried, canned, or frozen fruits that do not cause gas. Vegetables All fresh, canned (no salt added), or frozen vegetables that do not cause gas. Grains Whole-grain bread. Enriched whole-grain pasta. Fortified whole-grain cereals. Fortified rice. Quinoa. Meats and other proteins Lean meat. Poultry. Fish. Dried beans. Unsalted nuts. Tofu. Eggs. Nut butters. Dairy Whole or 2% milk. Cheese. Yogurt. Fats and oils Olive oil. Canola oil. Butter. Margarine. Beverages Water. Vegetable juice (no salt added). Decaffeinated coffee. Decaffeinated or herbal tea. Seasonings and condiments Fresh or dried herbs. Low-salt or salt-free seasonings. Low-sodium soy sauce. The items listed above may not be a complete list of foods   and beverages you can eat. Contact a dietitian for more information. What foods should I avoid? Fruits Fruits that cause gas, such as apples or melon. Vegetables Vegetables that cause gas, such as broccoli, Brussels sprouts, cabbage,  cauliflower, and onions. Canned vegetables with added salt. Meats and other proteins Fried meat. Salt-cured meat. Processed meat. Dairy Fat-free or low-fat milk, yogurt, or cheese. Processed cheese. Beverages Carbonated drinks. Caffeinated drinks, such as coffee, tea, and soft drinks. Juice. Alcohol. Vegetable juice with added salt. Seasonings and condiments Salt. Seasoning mixes with salt. Soy sauce. Pickles. Other foods Clear soup or broth. Fried foods. Prepared frozen meals. The items listed above may not be a complete list of foods and beverages you should avoid. Contact a dietitian for more information. Summary COPD symptoms can make it difficult to eat enough to maintain a healthy weight. A COPD eating plan can help you maintain your body weight and keep your lungs as healthy as possible. Eat a diet that is high in calories, protein, and other nutrients. Read labels to make sure that you are getting the right nutrients. Cook foods to make them easier to chew and swallow. Eat 5-6 small meals throughout the day, and avoid foods that cause gas or make you feel bloated. This information is not intended to replace advice given to you by your health care provider. Make sure you discuss any questions you have with your health care provider. Document Revised: 04/19/2020 Document Reviewed: 04/19/2020 Elsevier Patient Education  2024 [employer]  

## 2022-12-25 ENCOUNTER — Encounter: Payer: Self-pay | Admitting: Nurse Practitioner

## 2022-12-25 ENCOUNTER — Ambulatory Visit (INDEPENDENT_AMBULATORY_CARE_PROVIDER_SITE_OTHER): Payer: Medicare Other | Admitting: Nurse Practitioner

## 2022-12-25 VITALS — BP 132/79 | HR 79 | Temp 98.2°F | Ht 71.97 in | Wt 176.8 lb

## 2022-12-25 DIAGNOSIS — I493 Ventricular premature depolarization: Secondary | ICD-10-CM | POA: Diagnosis not present

## 2022-12-25 DIAGNOSIS — Z789 Other specified health status: Secondary | ICD-10-CM

## 2022-12-25 DIAGNOSIS — I10 Essential (primary) hypertension: Secondary | ICD-10-CM

## 2022-12-25 DIAGNOSIS — F17210 Nicotine dependence, cigarettes, uncomplicated: Secondary | ICD-10-CM | POA: Diagnosis not present

## 2022-12-25 DIAGNOSIS — E782 Mixed hyperlipidemia: Secondary | ICD-10-CM | POA: Diagnosis not present

## 2022-12-25 DIAGNOSIS — J432 Centrilobular emphysema: Secondary | ICD-10-CM | POA: Diagnosis not present

## 2022-12-25 DIAGNOSIS — D751 Secondary polycythemia: Secondary | ICD-10-CM

## 2022-12-25 DIAGNOSIS — I700 Atherosclerosis of aorta: Secondary | ICD-10-CM | POA: Diagnosis not present

## 2022-12-25 NOTE — Assessment & Plan Note (Signed)
Chronic, ongoing.  Continue current medication regimen and adjust as needed. Lipid panel today. 

## 2022-12-25 NOTE — Assessment & Plan Note (Signed)
Patient does endorse daily alcohol use -- AUDIT = 12 on check.  Recommend cutting back.

## 2022-12-25 NOTE — Assessment & Plan Note (Signed)
Ongoing and stable.  Continues on Metoprolol XL (due to COPD, preference for this BB) 25 MG daily to reduce HR and PVC burden. Heart rate and rhythm regular today. Continue this regimen. Declines cardiology referal. Consider cardiology visit in future if patient agreeable. 

## 2022-12-25 NOTE — Assessment & Plan Note (Signed)
Chronic, stable.  Noted on CT scan screening. Denies shortness of breath or cough. Not on any inhalers at this time, consider in future. Spirometry done in office 2023 and will continue yearly screening.  Recommend complete cessation of smoking.

## 2022-12-25 NOTE — Assessment & Plan Note (Signed)
I have recommended complete cessation of tobacco use. I have discussed various options available for assistance with tobacco cessation including over the counter methods (Nicotine gum, patch and lozenges). We also discussed prescription options (Chantix, Nicotine Inhaler / Nasal Spray). The patient is not interested in pursuing any prescription tobacco cessation options at this time.  

## 2022-12-25 NOTE — Assessment & Plan Note (Signed)
Chronic, stable on labs.  CBC and anemia panel ordered up to date.

## 2022-12-25 NOTE — Assessment & Plan Note (Signed)
Chronic, ongoing.  BP at goal in office today.  Recommend he monitor BP at least a few mornings a week at home and document.  DASH diet at home.  Continue current medication regimen and adjust as needed.  Labs today: CMP.  Return in 6 months.

## 2022-12-25 NOTE — Assessment & Plan Note (Signed)
Chronic.  Noted on lung CT screening.  Recommend complete cessation of smoking, pt not interested at this time.  Continue daily statin and ASA for prevention. 

## 2022-12-25 NOTE — Progress Notes (Signed)
BP 132/79   Pulse 79   Temp 98.2 F (36.8 C) (Oral)   Ht 5' 11.97" (1.828 m)   Wt 176 lb 12.8 oz (80.2 kg)   SpO2 97%   BMI 24.00 kg/m    Subjective:    Patient ID: Billy Duncan, male    DOB: 1951/03/23, 72 y.o.   MRN: [id number]  HPI: Billy Duncan is a 72 y.o. male  Chief Complaint  Patient presents with   Hypertension   Hyperlipidemia   Anemia   COPD   Alcohol use   HYPERTENSION / HYPERLIPIDEMIA Taking Metoprolol XL 25 MG daily for HTN and PVCs & Lisinopril + Atorvastatin daily. Satisfied with current treatment? yes Duration of hypertension: chronic BP monitoring frequency: not checking BP range:  BP medication side effects: no Duration of hyperlipidemia: chronic Cholesterol medication side effects: no Cholesterol supplements: none Medication compliance: good compliance Aspirin: yes Recent stressors: no Recurrent headaches: no Visual changes: no Palpitations: no Dyspnea: no Chest pain: no Lower extremity edema: no Dizzy/lightheaded: no The ASCVD Risk score (Arnett DK, et al., 2019) failed to calculate for the following reasons:   The valid HDL cholesterol range is 20 to 100 mg/dL   ANEMIA Taking Folic acid and B12.  Has daily alcohol use.  He reports that he continues to drink only two glasses of wine at night. Anemia status: stable Etiology of anemia: ?alcohol use Duration of anemia treatment: unknow Compliance with treatment: good compliance Iron supplementation side effects: no Severity of anemia: mild Fatigue: no Decreased exercise tolerance: no  Dyspnea on exertion: no Palpitations: no Bleeding: no Pica: no  Flowsheet Row Office Visit from 12/25/2022 in [facility]  Alcohol Use Disorder Identification Test Final Score (AUDIT) 12      COPD Centrilobular and paraseptal emphysema + aortic atherosclerosis noted on lung cancer screening, last screening 07/02/22.  Continues to smoke, smokes < 1 PPD, not interested in  quitting at this time. COPD status: stable Satisfied with current treatment?: yes Oxygen use: no Dyspnea frequency: no Cough frequency: no Rescue inhaler frequency: not using any COPD medications currently Limitation of activity: no Productive cough: no Last Spirometry: 07/25/21 -- FEV1 94% and FEV1/FVC 109% Pneumovax: Up to Date Influenza: Up to Date  Relevant past medical, surgical, family and social history reviewed and updated as indicated. Interim medical history since our last visit reviewed. Allergies and medications reviewed and updated.  Review of Systems  Constitutional:  Negative for activity change, diaphoresis, fatigue and fever.  HENT: Negative.    Eyes: Negative.   Respiratory:  Negative for cough, chest tightness, shortness of breath and wheezing.   Cardiovascular:  Negative for chest pain, palpitations and leg swelling.  Gastrointestinal: Negative.   Neurological:  Negative for dizziness, syncope, weakness, light-headedness, numbness and headaches.  Psychiatric/Behavioral: Negative.      Per HPI unless specifically indicated above     Objective:    BP 132/79   Pulse 79   Temp 98.2 F (36.8 C) (Oral)   Ht 5' 11.97" (1.828 m)   Wt 176 lb 12.8 oz (80.2 kg)   SpO2 97%   BMI 24.00 kg/m   Wt Readings from Last 3 Encounters:  12/25/22 176 lb 12.8 oz (80.2 kg)  10/08/22 172 lb (78 kg)  06/26/22 172 lb 14.4 oz (78.4 kg)    Physical Exam Vitals and nursing note reviewed.  Constitutional:      General: He is awake. He is not in acute distress.  Appearance: He is well-developed, well-groomed and underweight. He is not ill-appearing or toxic-appearing.  HENT:     Head: Normocephalic and atraumatic.     Right Ear: Hearing normal. No drainage.     Left Ear: Hearing normal. No drainage.  Eyes:     General: Lids are normal.        Right eye: No discharge.        Left eye: No discharge.     Conjunctiva/sclera: Conjunctivae normal.     Pupils: Pupils are  equal, round, and reactive to light.  Neck:     Vascular: No carotid bruit.  Cardiovascular:     Rate and Rhythm: Normal rate and regular rhythm.     Heart sounds: Normal heart sounds, S1 normal and S2 normal. No murmur heard.    No gallop.  Pulmonary:     Effort: Pulmonary effort is normal. No accessory muscle usage or respiratory distress.     Breath sounds: Normal breath sounds. No wheezing or rhonchi.  Abdominal:     General: Abdomen is flat. Bowel sounds are normal.     Palpations: Abdomen is soft.  Musculoskeletal:        General: Normal range of motion.     Cervical back: Normal range of motion and neck supple.     Right lower leg: No edema.     Left lower leg: No edema.  Skin:    General: Skin is warm and dry.     Capillary Refill: Capillary refill takes less than 2 seconds.  Neurological:     Mental Status: He is alert and oriented to person, place, and time.  Psychiatric:        Attention and Perception: Attention normal.        Mood and Affect: Mood normal.        Speech: Speech normal.        Behavior: Behavior normal. Behavior is cooperative.        Thought Content: Thought content normal.    Results for orders placed or performed in visit on 06/26/22  CBC with Differential/Platelet  Result Value Ref Range   WBC 4.5 3.4 - 10.8 x10E3/uL   RBC 4.11 (L) 4.14 - 5.80 x10E6/uL   Hemoglobin 13.3 13.0 - 17.7 g/dL   Hematocrit 40.9 81.1 - 51.0 %   MCV 95 79 - 97 fL   MCH 32.4 26.6 - 33.0 pg   MCHC 34.2 31.5 - 35.7 g/dL   RDW 91.4 78.2 - 95.6 %   Platelets 195 150 - 450 x10E3/uL   Neutrophils 53 Not Estab. %   Lymphs 33 Not Estab. %   Monocytes 10 Not Estab. %   Eos 3 Not Estab. %   Basos 1 Not Estab. %   Neutrophils Absolute 2.4 1.4 - 7.0 x10E3/uL   Lymphocytes Absolute 1.5 0.7 - 3.1 x10E3/uL   Monocytes Absolute 0.4 0.1 - 0.9 x10E3/uL   EOS (ABSOLUTE) 0.1 0.0 - 0.4 x10E3/uL   Basophils Absolute 0.0 0.0 - 0.2 x10E3/uL   Immature Granulocytes 0 Not Estab. %    Immature Grans (Abs) 0.0 0.0 - 0.1 x10E3/uL  Comprehensive metabolic panel  Result Value Ref Range   Glucose 92 70 - 99 mg/dL   BUN 9 8 - 27 mg/dL   Creatinine, Ser 2.13 0.76 - 1.27 mg/dL   eGFR 66 >08 MV/HQI/6.96   BUN/Creatinine Ratio 8 (L) 10 - 24   Sodium 135 134 - 144 mmol/L   Potassium 4.7 3.5 -  5.2 mmol/L   Chloride 97 96 - 106 mmol/L   CO2 23 20 - 29 mmol/L   Calcium 9.9 8.6 - 10.2 mg/dL   Total Protein 7.0 6.0 - 8.5 g/dL   Albumin 4.7 3.9 - 4.9 g/dL   Globulin, Total 2.3 1.5 - 4.5 g/dL   Albumin/Globulin Ratio 2.0 1.2 - 2.2   Bilirubin Total 1.1 0.0 - 1.2 mg/dL   Alkaline Phosphatase 74 44 - 121 IU/L   AST 24 0 - 40 IU/L   ALT 17 0 - 44 IU/L  Lipid Panel w/o Chol/HDL Ratio  Result Value Ref Range   Cholesterol, Total 191 100 - 199 mg/dL   Triglycerides 55 0 - 149 mg/dL   HDL 161 >09 mg/dL   VLDL Cholesterol Cal 11 5 - 40 mg/dL   LDL Chol Calc (NIH) 58 0 - 99 mg/dL  TSH  Result Value Ref Range   TSH 2.570 0.450 - 4.500 uIU/mL  Iron Binding Cap (TIBC)([facility])  Result Value Ref Range   Total Iron Binding Capacity 313 250 - 450 ug/dL   UIBC 604 540 - 981 ug/dL   Iron 191 38 - 478 ug/dL   Iron Saturation 46 15 - 55 %  Ferritin  Result Value Ref Range   Ferritin 289 30 - 400 ng/mL  Magnesium  Result Value Ref Range   Magnesium 1.6 1.6 - 2.3 mg/dL  PSA  Result Value Ref Range   Prostate Specific Ag, Serum <0.1 0.0 - 4.0 ng/mL      Assessment & Plan:   Problem List Items Addressed This Visit       Cardiovascular and Mediastinum   Atherosclerosis of aorta (HCC)    Chronic.  Noted on lung CT screening.  Recommend complete cessation of smoking, pt not interested at this time.  Continue daily statin and ASA for prevention.      Relevant Orders   Comprehensive metabolic panel   Lipid Panel w/o Chol/HDL Ratio   Frequent PVCs    Ongoing and stable.  Continues on Metoprolol XL (due to COPD, preference for this BB) 25 MG daily to reduce HR and PVC  burden. Heart rate and rhythm regular today. Continue this regimen. Declines cardiology referal. Consider cardiology visit in future if patient agreeable.      Hypertension    Chronic, ongoing.  BP at goal in office today.  Recommend he monitor BP at least a few mornings a week at home and document.  DASH diet at home.  Continue current medication regimen and adjust as needed.  Labs today: CMP.  Return in 6 months.       Relevant Orders   Comprehensive metabolic panel     Respiratory   Centrilobular emphysema (HCC) - Primary    Chronic, stable.  Noted on CT scan screening. Denies shortness of breath or cough. Not on any inhalers at this time, consider in future. Spirometry done in office 2023 and will continue yearly screening.  Recommend complete cessation of smoking.      Relevant Orders   CBC with Differential/Platelet     Other   Alcohol use    Patient does endorse daily alcohol use -- AUDIT = 12 on check.  Recommend cutting back.      Relevant Orders   CBC with Differential/Platelet   Comprehensive metabolic panel   Mixed hyperlipidemia    Chronic, ongoing.  Continue current medication regimen and adjust as needed.  Lipid panel today.      Relevant  Orders   Comprehensive metabolic panel   Lipid Panel w/o Chol/HDL Ratio   Nicotine dependence, cigarettes, uncomplicated    I have recommended complete cessation of tobacco use. I have discussed various options available for assistance with tobacco cessation including over the counter methods (Nicotine gum, patch and lozenges). We also discussed prescription options (Chantix, Nicotine Inhaler / Nasal Spray). The patient is not interested in pursuing any prescription tobacco cessation options at this time.       Polycythemia    Chronic, stable on labs.  CBC and anemia panel ordered up to date.      Relevant Orders   CBC with Differential/Platelet     Follow up plan: Return in about 6 months (around 06/27/2023) for COPD,  HTN/HLD, ALCOHOL USE, ANEMIA -- needs spirometry.

## 2022-12-26 LAB — CBC WITH DIFFERENTIAL/PLATELET
Basophils Absolute: 0.1 10*3/uL (ref 0.0–0.2)
Basos: 1 %
EOS (ABSOLUTE): 0.3 10*3/uL (ref 0.0–0.4)
Eos: 5 %
Hematocrit: 34.8 % — ABNORMAL LOW (ref 37.5–51.0)
Hemoglobin: 12.2 g/dL — ABNORMAL LOW (ref 13.0–17.7)
Immature Grans (Abs): 0 10*3/uL (ref 0.0–0.1)
Immature Granulocytes: 0 %
Lymphocytes Absolute: 1.8 10*3/uL (ref 0.7–3.1)
Lymphs: 34 %
MCH: 33 pg (ref 26.6–33.0)
MCHC: 35.1 g/dL (ref 31.5–35.7)
MCV: 94 fL (ref 79–97)
Monocytes Absolute: 0.6 10*3/uL (ref 0.1–0.9)
Monocytes: 12 %
Neutrophils Absolute: 2.6 10*3/uL (ref 1.4–7.0)
Neutrophils: 48 %
Platelets: 249 10*3/uL (ref 150–450)
RBC: 3.7 x10E6/uL — ABNORMAL LOW (ref 4.14–5.80)
RDW: 12.1 % (ref 11.6–15.4)
WBC: 5.4 10*3/uL (ref 3.4–10.8)

## 2022-12-26 LAB — COMPREHENSIVE METABOLIC PANEL
ALT: 20 IU/L (ref 0–44)
AST: 22 IU/L (ref 0–40)
Albumin: 4.4 g/dL (ref 3.8–4.8)
Alkaline Phosphatase: 100 IU/L (ref 44–121)
BUN/Creatinine Ratio: 10 (ref 10–24)
BUN: 11 mg/dL (ref 8–27)
Bilirubin Total: 0.8 mg/dL (ref 0.0–1.2)
CO2: 20 mmol/L (ref 20–29)
Calcium: 9.9 mg/dL (ref 8.6–10.2)
Chloride: 99 mmol/L (ref 96–106)
Creatinine, Ser: 1.07 mg/dL (ref 0.76–1.27)
Globulin, Total: 2.6 g/dL (ref 1.5–4.5)
Glucose: 99 mg/dL (ref 70–99)
Potassium: 4.6 mmol/L (ref 3.5–5.2)
Sodium: 135 mmol/L (ref 134–144)
Total Protein: 7 g/dL (ref 6.0–8.5)
eGFR: 74 mL/min/{1.73_m2} (ref >59)

## 2022-12-26 LAB — LIPID PANEL W/O CHOL/HDL RATIO
Cholesterol, Total: 177 mg/dL (ref 100–199)
HDL: 124 mg/dL (ref >39)
LDL Chol Calc (NIH): 41 mg/dL (ref 0–99)
Triglycerides: 61 mg/dL (ref 0–149)
VLDL Cholesterol Cal: 12 mg/dL (ref 5–40)

## 2022-12-26 NOTE — Progress Notes (Signed)
Good afternoon, please let Billy Duncan know his labs have returned: - CBC shows some mild anemia.  Continue current supplements as ordered. - Remainder of labs are stable - including normal kidney and liver function + cholesterol levels at goal.  Continue all medications as ordered and we will recheck levels next visit.  Any questions?   Keep being wonderful!!  Thank you for allowing me to participate in your care.  I appreciate you. Kindest regards, Akela Pocius

## 2023-06-28 ENCOUNTER — Ambulatory Visit: Payer: Medicare Other | Admitting: Nurse Practitioner

## 2023-06-28 DIAGNOSIS — K219 Gastro-esophageal reflux disease without esophagitis: Secondary | ICD-10-CM

## 2023-06-28 DIAGNOSIS — Z23 Encounter for immunization: Secondary | ICD-10-CM

## 2023-06-28 DIAGNOSIS — D751 Secondary polycythemia: Secondary | ICD-10-CM

## 2023-06-28 DIAGNOSIS — E782 Mixed hyperlipidemia: Secondary | ICD-10-CM

## 2023-06-28 DIAGNOSIS — F17210 Nicotine dependence, cigarettes, uncomplicated: Secondary | ICD-10-CM

## 2023-06-28 DIAGNOSIS — I10 Essential (primary) hypertension: Secondary | ICD-10-CM

## 2023-06-28 DIAGNOSIS — Z8546 Personal history of malignant neoplasm of prostate: Secondary | ICD-10-CM

## 2023-06-28 DIAGNOSIS — Z789 Other specified health status: Secondary | ICD-10-CM

## 2023-06-28 DIAGNOSIS — J432 Centrilobular emphysema: Secondary | ICD-10-CM

## 2023-06-28 DIAGNOSIS — I700 Atherosclerosis of aorta: Secondary | ICD-10-CM

## 2023-06-28 DIAGNOSIS — E538 Deficiency of other specified B group vitamins: Secondary | ICD-10-CM

## 2023-07-04 ENCOUNTER — Ambulatory Visit
Admission: RE | Admit: 2023-07-04 | Discharge: 2023-07-04 | Disposition: A | Discharge status: Home / Self Care | Payer: Medicare Other | Source: Ambulatory Visit | Attending: *Deleted | Admitting: *Deleted

## 2023-07-04 DIAGNOSIS — I700 Atherosclerosis of aorta: Secondary | ICD-10-CM | POA: Diagnosis not present

## 2023-07-04 DIAGNOSIS — F17210 Nicotine dependence, cigarettes, uncomplicated: Secondary | ICD-10-CM | POA: Diagnosis not present

## 2023-07-04 DIAGNOSIS — J439 Emphysema, unspecified: Secondary | ICD-10-CM | POA: Diagnosis not present

## 2023-07-04 DIAGNOSIS — Z87891 Personal history of nicotine dependence: Secondary | ICD-10-CM

## 2023-07-04 DIAGNOSIS — I2510 Atherosclerotic heart disease of native coronary artery without angina pectoris: Secondary | ICD-10-CM | POA: Insufficient documentation

## 2023-07-04 DIAGNOSIS — Z122 Encounter for screening for malignant neoplasm of respiratory organs: Secondary | ICD-10-CM | POA: Insufficient documentation

## 2023-07-15 ENCOUNTER — Other Ambulatory Visit: Payer: Self-pay | Admitting: Acute Care

## 2023-07-15 DIAGNOSIS — F17210 Nicotine dependence, cigarettes, uncomplicated: Secondary | ICD-10-CM

## 2023-07-15 DIAGNOSIS — Z87891 Personal history of nicotine dependence: Secondary | ICD-10-CM

## 2023-07-15 DIAGNOSIS — Z122 Encounter for screening for malignant neoplasm of respiratory organs: Secondary | ICD-10-CM

## 2023-07-15 NOTE — Patient Instructions (Signed)

## 2023-07-19 ENCOUNTER — Encounter: Payer: Self-pay | Admitting: Nurse Practitioner

## 2023-07-19 ENCOUNTER — Ambulatory Visit (INDEPENDENT_AMBULATORY_CARE_PROVIDER_SITE_OTHER): Payer: Medicare Other | Admitting: Nurse Practitioner

## 2023-07-19 VITALS — BP 137/72 | HR 72 | Temp 97.8°F | Ht 71.8 in | Wt 179.0 lb

## 2023-07-19 DIAGNOSIS — I700 Atherosclerosis of aorta: Secondary | ICD-10-CM | POA: Diagnosis not present

## 2023-07-19 DIAGNOSIS — J432 Centrilobular emphysema: Secondary | ICD-10-CM | POA: Diagnosis not present

## 2023-07-19 DIAGNOSIS — Z8546 Personal history of malignant neoplasm of prostate: Secondary | ICD-10-CM | POA: Diagnosis not present

## 2023-07-19 DIAGNOSIS — K219 Gastro-esophageal reflux disease without esophagitis: Secondary | ICD-10-CM | POA: Diagnosis not present

## 2023-07-19 DIAGNOSIS — Z789 Other specified health status: Secondary | ICD-10-CM | POA: Diagnosis not present

## 2023-07-19 DIAGNOSIS — Z23 Encounter for immunization: Secondary | ICD-10-CM

## 2023-07-19 DIAGNOSIS — E538 Deficiency of other specified B group vitamins: Secondary | ICD-10-CM

## 2023-07-19 DIAGNOSIS — I10 Essential (primary) hypertension: Secondary | ICD-10-CM

## 2023-07-19 DIAGNOSIS — D508 Other iron deficiency anemias: Secondary | ICD-10-CM

## 2023-07-19 DIAGNOSIS — F17210 Nicotine dependence, cigarettes, uncomplicated: Secondary | ICD-10-CM

## 2023-07-19 DIAGNOSIS — I493 Ventricular premature depolarization: Secondary | ICD-10-CM

## 2023-07-19 DIAGNOSIS — E782 Mixed hyperlipidemia: Secondary | ICD-10-CM | POA: Diagnosis not present

## 2023-07-19 DIAGNOSIS — F1090 Alcohol use, unspecified, uncomplicated: Secondary | ICD-10-CM

## 2023-07-19 MED ORDER — LISINOPRIL 10 MG PO TABS: 10.0000 mg | ORAL_TABLET | Freq: Every day | ORAL | 4 refills | Status: AC

## 2023-07-19 MED ORDER — FOLIC ACID 1 MG PO TABS: 1.0000 mg | ORAL_TABLET | Freq: Every day | ORAL | 4 refills | Status: AC

## 2023-07-19 MED ORDER — VITAMIN B-12 1000 MCG PO TABS: 1000.0000 ug | ORAL_TABLET | Freq: Every day | ORAL | 4 refills | Status: AC

## 2023-07-19 MED ORDER — ATORVASTATIN CALCIUM 20 MG PO TABS: 20.0000 mg | ORAL_TABLET | Freq: Every day | ORAL | 4 refills | Status: AC

## 2023-07-19 MED ORDER — METOPROLOL SUCCINATE ER 25 MG PO TB24: 25.0000 mg | ORAL_TABLET | Freq: Every day | ORAL | 4 refills | Status: AC

## 2023-07-19 MED ORDER — OMEPRAZOLE 20 MG PO CPDR: 20.0000 mg | DELAYED_RELEASE_CAPSULE | Freq: Every day | ORAL | 4 refills | Status: AC

## 2023-07-19 NOTE — Assessment & Plan Note (Signed)
Chronic, ongoing.  Continue current medication regimen and adjust as needed.  Mag level today. Risks of PPI use were discussed with patient including bone loss, C. Diff diarrhea, pneumonia, infections, CKD, electrolyte abnormalities.  Verbalizes understanding and chooses to continue the medication.

## 2023-07-19 NOTE — Assessment & Plan Note (Signed)
Ongoing and stable.  Continues on Metoprolol XL (due to COPD, preference for this BB) 25 MG daily to reduce HR and PVC burden. Heart rate and rhythm regular today. Continue this regimen. Declines cardiology referal. Consider cardiology visit in future if patient agreeable.

## 2023-07-19 NOTE — Assessment & Plan Note (Signed)
Chronic, ongoing.  BP at goal in office today.  Recommend he monitor BP at least a few mornings a week at home and document.  DASH diet at home.  Continue current medication regimen and adjust as needed.  Labs today: CBC, TSH, CMP.  Return in 6 months.

## 2023-07-19 NOTE — Assessment & Plan Note (Signed)
Chronic.  Noted on lung CT screening.  Recommend complete cessation of smoking, pt not interested at this time.  Continue daily statin and ASA for prevention.

## 2023-07-19 NOTE — Assessment & Plan Note (Signed)
Chronic and ongoing.  Labs today. Continue folate and highly recommend he take B12. Patient states he will try to take consistently.

## 2023-07-19 NOTE — Assessment & Plan Note (Signed)
Chronic, ongoing.  Continue current medication regimen and adjust as needed. Lipid panel today.

## 2023-07-19 NOTE — Assessment & Plan Note (Signed)
I have recommended complete cessation of tobacco use. I have discussed various options available for assistance with tobacco cessation including over the counter methods (Nicotine gum, patch and lozenges). We also discussed prescription options (Chantix, Nicotine Inhaler / Nasal Spray). The patient is not interested in pursuing any prescription tobacco cessation options at this time.

## 2023-07-19 NOTE — Assessment & Plan Note (Signed)
Not seeing urology, check PSA today.

## 2023-07-19 NOTE — Progress Notes (Signed)
BP 137/72 (BP Location: Left Arm, Cuff Size: Normal)   Pulse 72   Temp 97.8 F (36.6 C) (Oral)   Ht 5' 11.8" (1.824 m)   Wt 179 lb (81.2 kg)   SpO2 96%   BMI 24.41 kg/m    Subjective:    Patient ID: Billy Duncan, male    DOB: 07-20-1950, 73 y.o.   MRN: [id number]  HPI: Billy Duncan is a 73 y.o. male  Chief Complaint  Patient presents with   Alcohol Use   Anemia   COPD   Hyperlipidemia   Hypertension   History of prostate cancer with surgery >11 years ago, no longer follows with urology, last visit was 04/02/22 - to do annual PSA.  HYPERTENSION / HYPERLIPIDEMIA Continues on Metoprolol XL 25 MG daily for HTN and PVCs, Lisinopril + Atorvastatin daily. Satisfied with current treatment? yes Duration of hypertension: chronic BP monitoring frequency: not checking BP range:  BP medication side effects: no Duration of hyperlipidemia: chronic Cholesterol medication side effects: no Cholesterol supplements: none Medication compliance: good compliance Aspirin: yes Recent stressors: no Recurrent headaches: no Visual changes: no Palpitations: no Dyspnea: no Chest pain: no Lower extremity edema: no Dizzy/lightheaded: no The ASCVD Risk score (Arnett DK, et al., 2019) failed to calculate for the following reasons:   The valid HDL cholesterol range is 20 to 100 mg/dL   ANEMIA Continues on Folic acid and B12.  Continues to drink alcohol daily, about 2 glasses of wine a night -- often not more then this.  Takes Omeprazole for GERD and stomach protection with anemia, this helps to prevent heart burn. Anemia status: stable Etiology of anemia: ?alcohol use Duration of anemia treatment: unknow Compliance with treatment: good compliance Iron supplementation side effects: no Severity of anemia: mild Fatigue: no Decreased exercise tolerance: no  Dyspnea on exertion: no Palpitations: no Bleeding: no Pica: no  Flowsheet Row Office Visit from 07/19/2023 in [facility]  [facility]  Alcohol Use Disorder Identification Test Final Score (AUDIT) 9      COPD Centrilobular and paraseptal emphysema + aortic atherosclerosis noted on lung cancer screening, last screening 07/02/22.  Is continuing to smoke, less then one pack per day.  Started around age 89. COPD status: stable Satisfied with current treatment?: yes Oxygen use: no Dyspnea frequency: no Cough frequency: no Rescue inhaler frequency: refuses Limitation of activity: no Productive cough: no Last Spirometry: 07/25/21 -- FEV1 94% and FEV1/FVC 109% Pneumovax: Up to Date Influenza: Up to Date     07/19/2023    1:26 PM 12/25/2022   11:10 AM 10/08/2022    1:09 PM 12/22/2021   11:08 AM 07/25/2021    1:24 PM  Depression screen PHQ 2/9  Decreased Interest 0 0 0 0 0  Down, Depressed, Hopeless 0 0 0 0 0  PHQ - 2 Score 0 0 0 0 0  Altered sleeping 0 0 0 0 0  Tired, decreased energy 0 0 0 0 0  Change in appetite 0 0 0 0 0  Feeling bad or failure about yourself  0 0 0 0 0  Trouble concentrating 0 0 0 0 0  Moving slowly or fidgety/restless 0 0 0 0 0  Suicidal thoughts 0 0 0 0 0  PHQ-9 Score 0 0 0 0 0  Difficult doing work/chores Not difficult at all Not difficult at all Not difficult at all Not difficult at all        07/19/2023    1:26 PM  12/25/2022   11:10 AM 12/22/2021   11:08 AM 07/25/2021    1:24 PM  GAD 7 : Generalized Anxiety Score  Nervous, Anxious, on Edge 0 0 0 0  Control/stop worrying 0 0 0 0  Worry too much - different things 0 0 0 0  Trouble relaxing 0 0 0 0  Restless 0 0 0 0  Easily annoyed or irritable 0 0 0 0  Afraid - awful might happen 0 0 0 0  Total GAD 7 Score 0 0 0 0  Anxiety Difficulty Not difficult at all Not difficult at all Not difficult at all Not difficult at all   Relevant past medical, surgical, family and social history reviewed and updated as indicated. Interim medical history since our last visit reviewed. Allergies and medications reviewed and  updated.  Review of Systems  Constitutional:  Negative for activity change, diaphoresis, fatigue and fever.  HENT: Negative.    Eyes: Negative.   Respiratory:  Negative for cough, chest tightness, shortness of breath and wheezing.   Cardiovascular:  Negative for chest pain, palpitations and leg swelling.  Gastrointestinal: Negative.   Neurological:  Negative for dizziness, syncope, weakness, light-headedness, numbness and headaches.  Psychiatric/Behavioral: Negative.     Per HPI unless specifically indicated above     Objective:    BP 137/72 (BP Location: Left Arm, Cuff Size: Normal)   Pulse 72   Temp 97.8 F (36.6 C) (Oral)   Ht 5' 11.8" (1.824 m)   Wt 179 lb (81.2 kg)   SpO2 96%   BMI 24.41 kg/m   Wt Readings from Last 3 Encounters:  07/19/23 179 lb (81.2 kg)  12/25/22 176 lb 12.8 oz (80.2 kg)  10/08/22 172 lb (78 kg)    Physical Exam Vitals and nursing note reviewed.  Constitutional:      General: He is awake. He is not in acute distress.    Appearance: He is well-developed, well-groomed and underweight. He is not ill-appearing or toxic-appearing.  HENT:     Head: Normocephalic and atraumatic.     Right Ear: Hearing normal. No drainage.     Left Ear: Hearing normal. No drainage.  Eyes:     General: Lids are normal. No scleral icterus.       Right eye: No discharge.        Left eye: No discharge.     Conjunctiva/sclera: Conjunctivae normal.     Pupils: Pupils are equal, round, and reactive to light.  Neck:     Vascular: No carotid bruit.  Cardiovascular:     Rate and Rhythm: Normal rate and regular rhythm.     Heart sounds: Normal heart sounds, S1 normal and S2 normal. No murmur heard.    No gallop.  Pulmonary:     Effort: Pulmonary effort is normal. No accessory muscle usage or respiratory distress.     Breath sounds: Normal breath sounds. No wheezing or rhonchi.  Abdominal:     General: Abdomen is flat. Bowel sounds are normal.     Palpations: Abdomen is  soft.  Musculoskeletal:        General: Normal range of motion.     Cervical back: Normal range of motion and neck supple.     Right lower leg: No edema.     Left lower leg: No edema.  Skin:    General: Skin is warm and dry.     Capillary Refill: Capillary refill takes less than 2 seconds.  Neurological:  Mental Status: He is alert and oriented to person, place, and time.  Psychiatric:        Attention and Perception: Attention normal.        Mood and Affect: Mood normal.        Speech: Speech normal.        Behavior: Behavior normal. Behavior is cooperative.        Thought Content: Thought content normal.    Results for orders placed or performed in visit on 12/25/22  CBC with Differential/Platelet   Collection Time: 12/25/22 11:36 AM  Result Value Ref Range   WBC 5.4 3.4 - 10.8 x10E3/uL   RBC 3.70 (L) 4.14 - 5.80 x10E6/uL   Hemoglobin 12.2 (L) 13.0 - 17.7 g/dL   Hematocrit 16.1 (L) 09.6 - 51.0 %   MCV 94 79 - 97 fL   MCH 33.0 26.6 - 33.0 pg   MCHC 35.1 31.5 - 35.7 g/dL   RDW 04.5 40.9 - 81.1 %   Platelets 249 150 - 450 x10E3/uL   Neutrophils 48 Not Estab. %   Lymphs 34 Not Estab. %   Monocytes 12 Not Estab. %   Eos 5 Not Estab. %   Basos 1 Not Estab. %   Neutrophils Absolute 2.6 1.4 - 7.0 x10E3/uL   Lymphocytes Absolute 1.8 0.7 - 3.1 x10E3/uL   Monocytes Absolute 0.6 0.1 - 0.9 x10E3/uL   EOS (ABSOLUTE) 0.3 0.0 - 0.4 x10E3/uL   Basophils Absolute 0.1 0.0 - 0.2 x10E3/uL   Immature Granulocytes 0 Not Estab. %   Immature Grans (Abs) 0.0 0.0 - 0.1 x10E3/uL  Comprehensive metabolic panel   Collection Time: 12/25/22 11:36 AM  Result Value Ref Range   Glucose 99 70 - 99 mg/dL   BUN 11 8 - 27 mg/dL   Creatinine, Ser 9.14 0.76 - 1.27 mg/dL   eGFR 74 >78 GN/FAO/1.30   BUN/Creatinine Ratio 10 10 - 24   Sodium 135 134 - 144 mmol/L   Potassium 4.6 3.5 - 5.2 mmol/L   Chloride 99 96 - 106 mmol/L   CO2 20 20 - 29 mmol/L   Calcium 9.9 8.6 - 10.2 mg/dL   Total Protein 7.0  6.0 - 8.5 g/dL   Albumin 4.4 3.8 - 4.8 g/dL   Globulin, Total 2.6 1.5 - 4.5 g/dL   Bilirubin Total 0.8 0.0 - 1.2 mg/dL   Alkaline Phosphatase 100 44 - 121 IU/L   AST 22 0 - 40 IU/L   ALT 20 0 - 44 IU/L  Lipid Panel w/o Chol/HDL Ratio   Collection Time: 12/25/22 11:36 AM  Result Value Ref Range   Cholesterol, Total 177 100 - 199 mg/dL   Triglycerides 61 0 - 149 mg/dL   HDL 865 >78 mg/dL   VLDL Cholesterol Cal 12 5 - 40 mg/dL   LDL Chol Calc (NIH) 41 0 - 99 mg/dL      Assessment & Plan:   Problem List Items Addressed This Visit       Cardiovascular and Mediastinum   Atherosclerosis of aorta (HCC)   Chronic.  Noted on lung CT screening.  Recommend complete cessation of smoking, pt not interested at this time.  Continue daily statin and ASA for prevention.      Relevant Orders   Comprehensive metabolic panel   Lipid Panel w/o Chol/HDL Ratio   Frequent PVCs   Ongoing and stable.  Continues on Metoprolol XL (due to COPD, preference for this BB) 25 MG daily to reduce HR and  PVC burden. Heart rate and rhythm regular today. Continue this regimen. Declines cardiology referal. Consider cardiology visit in future if patient agreeable.      Hypertension   Chronic, ongoing.  BP at goal in office today.  Recommend he monitor BP at least a few mornings a week at home and document.  DASH diet at home.  Continue current medication regimen and adjust as needed.  Labs today: CBC, TSH, CMP.  Return in 6 months.       Relevant Orders   CBC with Differential/Platelet   Comprehensive metabolic panel   TSH     Respiratory   Centrilobular emphysema (HCC) - Primary   Chronic, stable.  Noted on CT lung screening. Denies shortness of breath or cough. Not on any inhalers at this time, consider in future. Spirometry done in office 2023 and will repeat next visit.  Recommend complete cessation of smoking.  Continue annual lung cancer screening.      Relevant Orders   CBC with Differential/Platelet      Digestive   GERD (gastroesophageal reflux disease)   Chronic, ongoing.  Continue current medication regimen and adjust as needed.  Mag level today.  Risks of PPI use were discussed with patient including bone loss, C. Diff diarrhea, pneumonia, infections, CKD, electrolyte abnormalities.  Verbalizes understanding and chooses to continue the medication.       Relevant Orders   Magnesium     Other   Alcohol use   Ongoing.  Endorses daily alcohol use -- AUDIT = 9 on check.  Recommend cutting back and working towards cessation.  Refuses AA meetings or medication.      Relevant Orders   CBC with Differential/Platelet   Comprehensive metabolic panel   Vitamin B12   Folate   Ferritin   Iron   B12 deficiency   Chronic and ongoing.  Continue daily supplement and recheck B12 level today.      Relevant Orders   CBC with Differential/Platelet   Vitamin B12   Folic acid deficiency   Chronic and ongoing.  Labs today. Continue folate and highly recommend he take B12. Patient states he will try to take consistently.      Relevant Orders   CBC with Differential/Platelet   Folate   Ferritin   Iron   Mixed hyperlipidemia   Chronic, ongoing.  Continue current medication regimen and adjust as needed.  Lipid panel today.      Relevant Orders   Comprehensive metabolic panel   Lipid Panel w/o Chol/HDL Ratio   Nicotine dependence, cigarettes, uncomplicated   I have recommended complete cessation of tobacco use. I have discussed various options available for assistance with tobacco cessation including over the counter methods (Nicotine gum, patch and lozenges). We also discussed prescription options (Chantix, Nicotine Inhaler / Nasal Spray). The patient is not interested in pursuing any prescription tobacco cessation options at this time.       Personal history of prostate cancer   Not seeing urology, check PSA today.      Relevant Orders   PSA   Other Visit Diagnoses       Other  iron deficiency anemia       Labs today   Relevant Orders   Folate   Ferritin   Iron     Hypomagnesemia       Mag level today   Relevant Orders   Magnesium     Need for influenza vaccination       Flu vaccine today,  educated patient.   Relevant Orders   Flu Vaccine Trivalent High Dose (Fluad)        Follow up plan: Return in about 6 months (around 01/16/2024) for HTN/HLD, COPD, ANEMIA.

## 2023-07-19 NOTE — Assessment & Plan Note (Signed)
Chronic and ongoing.  Continue daily supplement and recheck B12 level today.

## 2023-07-19 NOTE — Assessment & Plan Note (Signed)
Ongoing.  Endorses daily alcohol use -- AUDIT = 9 on check.  Recommend cutting back and working towards cessation.  Refuses AA meetings or medication.

## 2023-07-19 NOTE — Assessment & Plan Note (Addendum)
Chronic, stable.  Noted on CT lung screening. Denies shortness of breath or cough. Not on any inhalers at this time, consider in future. Spirometry done in office 2023 and will repeat next visit.  Recommend complete cessation of smoking.  Continue annual lung cancer screening.

## 2023-07-20 ENCOUNTER — Other Ambulatory Visit: Payer: Self-pay | Admitting: Nurse Practitioner

## 2023-07-20 DIAGNOSIS — E871 Hypo-osmolality and hyponatremia: Secondary | ICD-10-CM

## 2023-07-20 LAB — LIPID PANEL W/O CHOL/HDL RATIO
Cholesterol, Total: 189 mg/dL (ref 100–199)
HDL: 137 mg/dL (ref >39)
LDL Chol Calc (NIH): 42 mg/dL (ref 0–99)
Triglycerides: 53 mg/dL (ref 0–149)
VLDL Cholesterol Cal: 10 mg/dL (ref 5–40)

## 2023-07-20 LAB — CBC WITH DIFFERENTIAL/PLATELET
Basophils Absolute: 0.1 10*3/uL (ref 0.0–0.2)
Basos: 1 %
EOS (ABSOLUTE): 0.2 10*3/uL (ref 0.0–0.4)
Eos: 4 %
Hematocrit: 39.1 % (ref 37.5–51.0)
Hemoglobin: 13.2 g/dL (ref 13.0–17.7)
Immature Grans (Abs): 0 10*3/uL (ref 0.0–0.1)
Immature Granulocytes: 0 %
Lymphocytes Absolute: 1.9 10*3/uL (ref 0.7–3.1)
Lymphs: 34 %
MCH: 33.2 pg — ABNORMAL HIGH (ref 26.6–33.0)
MCHC: 33.8 g/dL (ref 31.5–35.7)
MCV: 98 fL — ABNORMAL HIGH (ref 79–97)
Monocytes Absolute: 0.6 10*3/uL (ref 0.1–0.9)
Monocytes: 10 %
Neutrophils Absolute: 2.8 10*3/uL (ref 1.4–7.0)
Neutrophils: 51 %
Platelets: 209 10*3/uL (ref 150–450)
RBC: 3.98 x10E6/uL — ABNORMAL LOW (ref 4.14–5.80)
RDW: 12.5 % (ref 11.6–15.4)
WBC: 5.6 10*3/uL (ref 3.4–10.8)

## 2023-07-20 LAB — COMPREHENSIVE METABOLIC PANEL
ALT: 30 [IU]/L (ref 0–44)
AST: 42 [IU]/L — ABNORMAL HIGH (ref 0–40)
Albumin: 4.4 g/dL (ref 3.8–4.8)
Alkaline Phosphatase: 98 [IU]/L (ref 44–121)
BUN/Creatinine Ratio: 8 — ABNORMAL LOW (ref 10–24)
BUN: 8 mg/dL (ref 8–27)
Bilirubin Total: 0.9 mg/dL (ref 0.0–1.2)
CO2: 20 mmol/L (ref 20–29)
Calcium: 9.8 mg/dL (ref 8.6–10.2)
Chloride: 94 mmol/L — ABNORMAL LOW (ref 96–106)
Creatinine, Ser: 1.04 mg/dL (ref 0.76–1.27)
Globulin, Total: 2.3 g/dL (ref 1.5–4.5)
Glucose: 93 mg/dL (ref 70–99)
Potassium: 4.9 mmol/L (ref 3.5–5.2)
Sodium: 130 mmol/L — ABNORMAL LOW (ref 134–144)
Total Protein: 6.7 g/dL (ref 6.0–8.5)
eGFR: 77 mL/min/{1.73_m2} (ref >59)

## 2023-07-20 LAB — MAGNESIUM: Magnesium: 1.6 mg/dL (ref 1.6–2.3)

## 2023-07-20 LAB — TSH: TSH: 1.9 u[IU]/mL (ref 0.450–4.500)

## 2023-07-20 LAB — PSA: Prostate Specific Ag, Serum: <0.1 ng/mL (ref 0.0–4.0)

## 2023-07-20 LAB — FERRITIN: Ferritin: 496 ng/mL — ABNORMAL HIGH (ref 30–400)

## 2023-07-20 LAB — IRON: Iron: 135 ug/dL (ref 38–169)

## 2023-07-20 LAB — FOLATE: Folate: 14.4 ng/mL (ref >3.0)

## 2023-07-20 LAB — VITAMIN B12: Vitamin B-12: 1225 pg/mL (ref 232–1245)

## 2023-07-20 NOTE — Progress Notes (Signed)
Good morning crew please call patient with lab results and will need lab only visit in 2 weeks please: Good morning Billy Duncan, your labs have returned and overall are stable with no medication changes needed.  Only concerns are sodium, salt, level a little low.  Please add just a little tablet salt to you diet and reduce alcohol intake.  I would like to recheck this outpatient in 2 weeks as if gets too low it could cause issues.  Ferritin level is mildly elevated, we will recheck this next visit.  Any questions? Keep being stellar!!  Thank you for allowing me to participate in your care.  I appreciate you. Kindest regards, Stormee Duda

## 2023-08-05 ENCOUNTER — Other Ambulatory Visit: Payer: Medicare Other

## 2023-08-05 DIAGNOSIS — E871 Hypo-osmolality and hyponatremia: Secondary | ICD-10-CM | POA: Diagnosis not present

## 2023-08-06 ENCOUNTER — Other Ambulatory Visit: Payer: Self-pay | Admitting: Nurse Practitioner

## 2023-08-06 DIAGNOSIS — E871 Hypo-osmolality and hyponatremia: Secondary | ICD-10-CM | POA: Insufficient documentation

## 2023-08-06 LAB — SODIUM: Sodium: 133 mmol/L — ABNORMAL LOW (ref 134–144)

## 2023-08-06 NOTE — Progress Notes (Signed)
Good afternoon, please let Billy Duncan know his sodium level is still low but is trending up.  I would like to recheck again in 2 weeks via outpatient labs, he will need visit scheduled for this.  I recommend you continue to add a little table salt to diet and cut back on alcohol intake, heavy alcohol intake can cause low levels and low levels can affect the nervous system.  Any questions? Keep being stellar!!  Thank you for allowing me to participate in your care.  I appreciate you. Kindest regards, Drayven Marchena

## 2023-08-21 ENCOUNTER — Other Ambulatory Visit: Payer: Medicare Other

## 2023-08-21 DIAGNOSIS — E871 Hypo-osmolality and hyponatremia: Secondary | ICD-10-CM | POA: Diagnosis not present

## 2023-08-22 LAB — SODIUM: Sodium: 131 mmol/L — ABNORMAL LOW (ref 134–144)

## 2023-08-22 NOTE — Progress Notes (Signed)
 Good morning crew, please let Billy Duncan know his labs have returned and sodium, salt, level remains low.  I do want him to continue to add some tablet salt to his diet + reduce alcohol intake as we do not want this to get too low.  A very low sodium level can lead to severe issues, including seizures. Since next visit is not until August, I would like to recheck salt level in 6 weeks outpatient.  Please schedule a lab only visit.  Any questions? Keep being stellar!!  Thank you for allowing me to participate in your care.  I appreciate you. Kindest regards, Valissa Lyvers

## 2023-10-02 ENCOUNTER — Other Ambulatory Visit: Payer: Medicare Other

## 2023-10-17 ENCOUNTER — Ambulatory Visit: Payer: Self-pay

## 2023-10-17 VITALS — Ht 72.0 in | Wt 175.0 lb

## 2023-10-17 DIAGNOSIS — Z0000 Encounter for general adult medical examination without abnormal findings: Secondary | ICD-10-CM | POA: Diagnosis not present

## 2023-10-17 NOTE — Progress Notes (Signed)
 Subjective:   Billy Duncan is a 73 y.o. who presents for a Medicare Wellness preventive visit.  Visit Complete: Virtual I connected with  Billy Duncan on 10/17/23 by a audio enabled telemedicine application and verified that I am speaking with the correct person using two identifiers.  Patient Location: Home  Provider Location: Home Office  I discussed the limitations of evaluation and management by telemedicine. The patient expressed understanding and agreed to proceed.  Vital Signs: Because this visit was a virtual/telehealth visit, some criteria may be missing or patient reported. Any vitals not documented were not able to be obtained and vitals that have been documented are patient reported.  VideoDeclined- This patient declined Librarian, academic. Therefore the visit was completed with audio only.  Persons Participating in Visit: Patient.  AWV Questionnaire: No: Patient Medicare AWV questionnaire was not completed prior to this visit.  Cardiac Risk Factors include: advanced age (>68men, >75 women);male gender;dyslipidemia;hypertension;smoking/ tobacco exposure     Objective:    Today's Vitals   10/17/23 1039  Weight: 175 lb (79.4 kg)  Height: 6' (1.829 m)   Body mass index is 23.73 kg/m.     10/17/2023   10:48 AM 10/08/2022    1:11 PM 05/13/2020    8:21 AM 05/13/2019    1:44 PM 02/20/2018   10:21 AM  Advanced Directives  Does Patient Have a Medical Advance Directive? No No No No No  Would patient like information on creating a medical advance directive? Yes (MAU/Ambulatory/Procedural Areas - Information given) No - Patient declined   No - Patient declined    Current Medications (verified) Outpatient Encounter Medications as of 10/17/2023  Medication Sig   aspirin EC 81 MG tablet Take 81 mg by mouth daily.   atorvastatin  (LIPITOR) 20 MG tablet Take 1 tablet (20 mg total) by mouth daily.   cyanocobalamin  (VITAMIN B12) 1000 MCG  tablet Take 1 tablet (1,000 mcg total) by mouth daily.   folic acid  (FOLVITE ) 1 MG tablet Take 1 tablet (1 mg total) by mouth daily.   lisinopril  (ZESTRIL ) 10 MG tablet Take 1 tablet (10 mg total) by mouth daily.   metoprolol  succinate (TOPROL -XL) 25 MG 24 hr tablet Take 1 tablet (25 mg total) by mouth daily.   omeprazole  (PRILOSEC) 20 MG capsule Take 1 capsule (20 mg total) by mouth daily.   No facility-administered encounter medications on file as of 10/17/2023.    Allergies (verified) Patient has no known allergies.   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HCC)    Polycythemia    Tobacco use    Past Surgical History:  Procedure Laterality Date   FOOT NEUROMA SURGERY Left    PROSTATECTOMY  03/2012   Family History  Problem Relation Age of Onset   Heart disease Mother        a-fib   Dementia Mother    Stomach cancer Father    Heart disease Brother    Heart disease Brother    Social History   Socioeconomic History   Marital status: Divorced    Spouse name: Not on file   Number of children: 2   Years of education: Not on file   Highest education level: Some college, no degree  Occupational History   Occupation: retired  Tobacco Use   Smoking status: Every Day    Current packs/day: 1.00    Average packs/day: 1 pack/day for 53.3 years (53.3  ttl pk-yrs)    Types: Cigarettes    Start date: 56   Smokeless tobacco: Never  Vaping Use   Vaping status: Never Used  Substance and Sexual Activity   Alcohol use: Yes    Alcohol/week: 14.0 standard drinks of alcohol    Types: 14 Glasses of wine per week    Comment: 2 glasses of wine every night   Drug use: No   Sexual activity: Not Currently    Comment: pt states very seldem  Other Topics Concern   Not on file  Social History Narrative   Not on file   Social Drivers of Health   Financial Resource Strain: Low Risk  (10/17/2023)   Overall Financial Resource  Strain (CARDIA)    Difficulty of Paying Living Expenses: Not hard at all  Food Insecurity: No Food Insecurity (10/17/2023)   Hunger Vital Sign    Worried About Running Out of Food in the Last Year: Never true    Ran Out of Food in the Last Year: Never true  Transportation Needs: No Transportation Needs (10/17/2023)   PRAPARE - Administrator, Civil Service (Medical): No    Lack of Transportation (Non-Medical): No  Physical Activity: Sufficiently Active (10/17/2023)   Exercise Vital Sign    Days of Exercise per Week: 3 days    Minutes of Exercise per Session: 60 min  Stress: No Stress Concern Present (10/17/2023)   [employer] of Occupational Health - Occupational Stress Questionnaire    Feeling of Stress : Not at all  Social Connections: Socially Isolated (10/17/2023)   Social Connection and Isolation Panel [NHANES]    Frequency of Communication with Friends and Family: More than three times a week    Frequency of Social Gatherings with Friends and Family: Once a week    Attends Religious Services: Never    Database administrator or Organizations: No    Attends Engineer, structural: Never    Marital Status: Divorced    Tobacco Counseling Ready to quit: No Counseling given: No    Clinical Intake:  Pre-visit preparation completed: Yes  Pain : No/denies pain     BMI - recorded: 23.73 Nutritional Status: BMI of 19-24  Normal Nutritional Risks: None Diabetes: No  No results found for: "HGBA1C"   How often do you need to have someone help you when you read instructions, pamphlets, or other written materials from your doctor or pharmacy?: 1 - Never  Interpreter Needed?: No  Information entered by :: Jaunita Messier, CMA   Activities of Daily Living     10/17/2023   10:41 AM  In your present state of health, do you have any difficulty performing the following activities:  Hearing? 0  Vision? 0  Difficulty concentrating or making decisions? 0   Walking or climbing stairs? 0  Dressing or bathing? 0  Doing errands, shopping? 0  Preparing Food and eating ? N  Using the Toilet? N  In the past six months, have you accidently leaked urine? Y  Comment sometimes, hx of prostate cancer  Do you have problems with loss of bowel control? N  Managing your Medications? N  Managing your Finances? N  Housekeeping or managing your Housekeeping? N    Patient Care Team: Cannady, Jolene T, NP as PCP - General (Nurse Practitioner) [place], [name] (Optometry)  Indicate any recent Medical Services you may have received from other than [facility] providers in the past year (date may be approximate).  Assessment:   This is a routine wellness examination for [place].  Hearing/Vision screen Hearing Screening - Comments:: Denies hearing loss Vision Screening - Comments:: Gets eye exams, [name], [place]   Goals Addressed             This Visit's Progress    Patient Stated       Maintain current health       Depression Screen     10/17/2023   10:47 AM 07/19/2023    1:26 PM 12/25/2022   11:10 AM 10/08/2022    1:09 PM 12/22/2021   11:08 AM 07/25/2021    1:24 PM 07/24/2021    1:02 PM  PHQ 2/9 Scores  PHQ - 2 Score 0 0 0 0 0 0 0  PHQ- 9 Score 0 0 0 0 0 0 0    Fall Risk     10/17/2023   10:50 AM 07/19/2023    1:17 PM 12/25/2022   11:10 AM 12/22/2021   11:08 AM 07/25/2021    1:24 PM  Fall Risk   Falls in the past year? 0 0 0 0 0  Number falls in past yr: 0 0 0 0 0  Injury with Fall? 0 0 0 0 0  Risk for fall due to : No Fall Risks No Fall Risks No Fall Risks No Fall Risks No Fall Risks  Follow up Falls prevention discussed;Falls evaluation completed Falls evaluation completed Falls evaluation completed Falls evaluation completed Falls evaluation completed    MEDICARE RISK AT HOME:  Medicare Risk at Home Any stairs in or around the home?: Yes If so, are there any without handrails?: No Home free of loose throw  rugs in walkways, pet beds, electrical cords, etc?: Yes Adequate lighting in your home to reduce risk of falls?: Yes Life alert?: No Use of a cane, walker or w/c?: No Grab bars in the bathroom?: No Shower chair or bench in shower?: No Elevated toilet seat or a handicapped toilet?: No  TIMED UP AND GO:  Was the test performed?  No  Cognitive Function: 6CIT completed        10/17/2023   10:52 AM 10/08/2022    1:15 PM 05/13/2020    8:24 AM 02/20/2018   10:21 AM  6CIT Screen  What Year? 0 points 0 points 0 points 0 points  What month? 0 points 0 points 0 points 0 points  What time? 0 points 0 points 0 points 0 points  Count back from 20 0 points 0 points 0 points 0 points  Months in reverse 0 points 0 points 2 points 0 points  Repeat phrase 0 points 0 points 2 points 0 points  Total Score 0 points 0 points 4 points 0 points    Immunizations Immunization History  Administered Date(s) Administered   Fluad Quad(high Dose 65+) 06/26/2022   Fluad Trivalent(High Dose 65+) 07/19/2023   Influenza, High Dose Seasonal PF 02/20/2018   Influenza,inj,Quad PF,6+ Mos 04/30/2016   PFIZER(Purple Top)SARS-COV-2 Vaccination 08/24/2019, 09/14/2019, 06/12/2020   Pneumococcal Conjugate-13 10/18/2017   Pneumococcal Polysaccharide-23 01/05/2013, 12/12/2020   Td 12/22/2021   Tdap 03/10/2009   Zoster, Live 01/05/2013    Screening Tests Health Maintenance  Topic Date Due   Zoster Vaccines- Shingrix (1 of 2) 11/28/2001   COVID-19 Vaccine (4 - 2024-25 season) 02/24/2023   INFLUENZA VACCINE  01/24/2024   Lung Cancer Screening  07/03/2024   Colonoscopy  09/12/2024   Medicare Annual Wellness (AWV)  10/16/2024   DTaP/Tdap/Td (3 -  Td or Tdap) 12/23/2031   Pneumonia Vaccine 50+ Years old  Completed   Hepatitis C Screening  Completed   HPV VACCINES  Aged Out   Meningococcal B Vaccine  Aged Out    Health Maintenance  Health Maintenance Due  Topic Date Due   Zoster Vaccines- Shingrix (1 of 2)  11/28/2001   COVID-19 Vaccine (4 - 2024-25 season) 02/24/2023   Health Maintenance Items Addressed: See Nurse Notes  Additional Screening:  Vision Screening: Recommended annual ophthalmology exams for early detection of glaucoma and other disorders of the eye.  Dental Screening: Recommended annual dental exams for proper oral hygiene  Community Resource Referral / Chronic Care Management: CRR required this visit?  No   CCM required this visit?  No     Plan:     I have personally reviewed and noted the following in the patient's chart:   Medical and social history Use of alcohol, tobacco or illicit drugs  Current medications and supplements including opioid prescriptions. Patient is not currently taking opioid prescriptions. Functional ability and status Nutritional status Physical activity Advanced directives List of other physicians Hospitalizations, surgeries, and ER visits in previous 12 months Vitals Screenings to include cognitive, depression, and falls Referrals and appointments  In addition, I have reviewed and discussed with patient certain preventive protocols, quality metrics, and best practice recommendations. A written personalized care plan for preventive services as well as general preventive health recommendations were provided to patient.     Jaunita Messier, CMA   10/17/2023   After Visit Summary: (Mail) Due to this being a telephonic visit, the after visit summary with patients personalized plan was offered to patient via mail   Notes:  Declined Shingles and Covid vaccines

## 2023-10-17 NOTE — Patient Instructions (Addendum)
 Mr. Lindon , Thank you for taking time to come for your Medicare Wellness Visit. I appreciate your ongoing commitment to your health goals. Please review the following plan we discussed and let me know if I can assist you in the future.   Referrals/Orders/Follow-Ups/Clinician Recommendations: Keep up the good work!  This is a list of the screening recommended for you and due dates:  Health Maintenance  Topic Date Due   Zoster (Shingles) Vaccine (1 of 2) 11/28/2001   COVID-19 Vaccine (4 - 2024-25 season) 02/24/2023   Flu Shot  01/24/2024   Screening for Lung Cancer  07/03/2024   Colon Cancer Screening  09/12/2024   Medicare Annual Wellness Visit  10/16/2024   DTaP/Tdap/Td vaccine (3 - Td or Tdap) 12/23/2031   Pneumonia Vaccine  Completed   Hepatitis C Screening  Completed   HPV Vaccine  Aged Out   Meningitis B Vaccine  Aged Out    Advanced directives: (Provided) Advance directive discussed with you today. I have provided a copy for you to complete at home and have notarized. Once this is complete, please bring a copy in to our office so we can scan it into your chart.   Next Medicare Annual Wellness Visit scheduled for next year: Yes, 10/29/24 @ 11:20am (phone visit)

## 2024-01-20 NOTE — Patient Instructions (Incomplete)

## 2024-01-24 ENCOUNTER — Ambulatory Visit: Payer: Self-pay | Admitting: Nurse Practitioner

## 2024-01-24 DIAGNOSIS — E782 Mixed hyperlipidemia: Secondary | ICD-10-CM

## 2024-01-24 DIAGNOSIS — F17210 Nicotine dependence, cigarettes, uncomplicated: Secondary | ICD-10-CM

## 2024-01-24 DIAGNOSIS — I10 Essential (primary) hypertension: Secondary | ICD-10-CM

## 2024-01-24 DIAGNOSIS — F1090 Alcohol use, unspecified, uncomplicated: Secondary | ICD-10-CM

## 2024-01-24 DIAGNOSIS — I700 Atherosclerosis of aorta: Secondary | ICD-10-CM

## 2024-01-24 DIAGNOSIS — J432 Centrilobular emphysema: Secondary | ICD-10-CM

## 2024-01-24 DIAGNOSIS — E871 Hypo-osmolality and hyponatremia: Secondary | ICD-10-CM

## 2024-07-07 ENCOUNTER — Ambulatory Visit
Admission: RE | Admit: 2024-07-07 | Discharge: 2024-07-07 | Disposition: A | Discharge status: Home / Self Care | Source: Ambulatory Visit | Attending: Acute Care | Admitting: Acute Care

## 2024-07-07 DIAGNOSIS — F17210 Nicotine dependence, cigarettes, uncomplicated: Secondary | ICD-10-CM | POA: Insufficient documentation

## 2024-07-07 DIAGNOSIS — Z122 Encounter for screening for malignant neoplasm of respiratory organs: Secondary | ICD-10-CM | POA: Diagnosis present

## 2024-07-07 DIAGNOSIS — Z87891 Personal history of nicotine dependence: Secondary | ICD-10-CM | POA: Diagnosis present

## 2024-07-17 ENCOUNTER — Telehealth: Payer: Self-pay

## 2024-07-17 ENCOUNTER — Other Ambulatory Visit: Payer: Self-pay

## 2024-07-17 DIAGNOSIS — Z122 Encounter for screening for malignant neoplasm of respiratory organs: Secondary | ICD-10-CM

## 2024-07-17 DIAGNOSIS — Z87891 Personal history of nicotine dependence: Secondary | ICD-10-CM

## 2024-07-17 DIAGNOSIS — F17210 Nicotine dependence, cigarettes, uncomplicated: Secondary | ICD-10-CM

## 2024-07-17 NOTE — Telephone Encounter (Signed)
 Called patient to review results. LVM.   IMPRESSION: Lung-RADS 2, benign appearance or behavior. Continue annual screening with low-dose chest CT without contrast in 12 months.   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 Similar.

## 2024-07-17 NOTE — Telephone Encounter (Signed)
 Spoke with patient and reviewed results. He will complete an annual Lung CT again next year. Order placed. Offered patient an appointment to see one of our ILD specialist due to possible ILD noted on most recent CT. Pt declined, request he speak with his PCP first. Pt has contact information if he changes his mind. Results and plan to PCP.   Jolene, results have been sent to your office for review per patient request. Have a good weekend.   Isaiah, RN

## 2024-10-29 ENCOUNTER — Ambulatory Visit
